# Patient Record
Sex: Female | Born: 1980 | Race: Black or African American | Hispanic: No | Marital: Single | Smoking: Former smoker
Health system: Southern US, Community
[De-identification: ages and names within clinical notes are randomized; demographics above are authoritative.]

## PROBLEM LIST (undated history)

## (undated) DIAGNOSIS — K219 Gastro-esophageal reflux disease without esophagitis: Secondary | ICD-10-CM

## (undated) DIAGNOSIS — D649 Anemia, unspecified: Secondary | ICD-10-CM

## (undated) DIAGNOSIS — I10 Essential (primary) hypertension: Secondary | ICD-10-CM

## (undated) DIAGNOSIS — G43909 Migraine, unspecified, not intractable, without status migrainosus: Secondary | ICD-10-CM

## (undated) DIAGNOSIS — J42 Unspecified chronic bronchitis: Secondary | ICD-10-CM

## (undated) HISTORY — DX: Migraine, unspecified, not intractable, without status migrainosus: G43.909

---

## 2008-08-27 HISTORY — PX: TUBAL LIGATION: SHX77

## 2015-02-25 ENCOUNTER — Emergency Department
Admission: EM | Admit: 2015-02-25 | Discharge: 2015-02-25 | Disposition: A | Discharge status: Home / Self Care | Payer: 59 | Attending: Emergency Medicine | Admitting: Emergency Medicine

## 2015-02-25 ENCOUNTER — Encounter: Payer: Self-pay | Admitting: Emergency Medicine

## 2015-02-25 DIAGNOSIS — B3731 Acute candidiasis of vulva and vagina: Secondary | ICD-10-CM

## 2015-02-25 DIAGNOSIS — B373 Candidiasis of vulva and vagina: Secondary | ICD-10-CM

## 2015-02-25 DIAGNOSIS — Z3202 Encounter for pregnancy test, result negative: Secondary | ICD-10-CM | POA: Diagnosis not present

## 2015-02-25 DIAGNOSIS — N390 Urinary tract infection, site not specified: Secondary | ICD-10-CM | POA: Diagnosis not present

## 2015-02-25 DIAGNOSIS — Z720 Tobacco use: Secondary | ICD-10-CM | POA: Insufficient documentation

## 2015-02-25 DIAGNOSIS — L293 Anogenital pruritus, unspecified: Secondary | ICD-10-CM | POA: Diagnosis present

## 2015-02-25 LAB — URINALYSIS COMPLETE WITH MICROSCOPIC (ARMC ONLY)
BILIRUBIN URINE: NEGATIVE
GLUCOSE, UA: NEGATIVE mg/dL
Ketones, ur: NEGATIVE mg/dL
Nitrite: NEGATIVE
Protein, ur: NEGATIVE mg/dL
SPECIFIC GRAVITY, URINE: 1.014 (ref 1.005–1.030)
pH: 6 (ref 5.0–8.0)

## 2015-02-25 LAB — CHLAMYDIA/NGC RT PCR (ARMC ONLY)
CHLAMYDIA TR: NOT DETECTED
N gonorrhoeae: NOT DETECTED

## 2015-02-25 LAB — POCT PREGNANCY, URINE: Preg Test, Ur: NEGATIVE

## 2015-02-25 LAB — WET PREP, GENITAL
Clue Cells Wet Prep HPF POC: NONE SEEN
Trich, Wet Prep: NONE SEEN

## 2015-02-25 MED ORDER — SULFAMETHOXAZOLE-TRIMETHOPRIM 800-160 MG PO TABS: 1.0000 | ORAL_TABLET | Freq: Two times a day (BID) | ORAL | Status: DC

## 2015-02-25 MED ORDER — FLUCONAZOLE 150 MG PO TABS
150.0000 mg | ORAL_TABLET | Freq: Every day | ORAL | Status: DC
Start: 2015-02-25 — End: 2017-03-10

## 2015-02-25 NOTE — ED Notes (Signed)
Pt states she wants to be checked for std, has a new partner and has noticed some "bumps" on him, states she his having vaginal itching and discharge this week, had blood in her urine a few days ago.

## 2015-02-25 NOTE — ED Provider Notes (Signed)
[facility] Emergency Department Provider Note  ____________________________________________  Time seen: 819  I have reviewed the triage vital signs and the nursing notes.   HISTORY  Chief Complaint Vaginal Itching; Vaginal Discharge; and SEXUALLY TRANSMITTED DISEASE   HPI Kaitlyn Hall is a 34 y.o. female is here with complaint of vaginal itching and discharge for 1 week. She states that her partner was here yesterday for bumps on his scrotum and she now wants to be checked out for an STD. She also noticed hematuria a few days ago and is not aware of any prior urinary tract infections. She is having some vaginal burning and burning on urination. She rates her pain as a day for 10. This is a constant pain with no improvement.   History reviewed. No pertinent past medical history.  There are no active problems to display for this patient.   Past Surgical History  Procedure Laterality Date  . Tubal ligation      Current Outpatient Rx  Name  Route  Sig  Dispense  Refill  . fluconazole (DIFLUCAN) 150 MG tablet   Oral   Take 1 tablet (150 mg total) by mouth daily.   1 tablet   0   . sulfamethoxazole-trimethoprim (BACTRIM DS,SEPTRA DS) 800-160 MG per tablet   Oral   Take 1 tablet by mouth 2 (two) times daily.   20 tablet   0     Allergies Review of patient's allergies indicates no known allergies.  No family history on file.  Social History History  Substance Use Topics  . Smoking status: Current Some Day Smoker    Types: Cigarettes  . Smokeless tobacco: Not on file  . Alcohol Use: No    Review of Systems Constitutional: No fever/chills Eyes: No visual changes. ENT: No sore throat. Cardiovascular: Denies chest pain. Respiratory: Denies shortness of breath. Gastrointestinal: No abdominal pain.  No nausea, no vomiting.   Genitourinary: Positive for dysuria. Musculoskeletal: Negative for back pain. Skin: Negative for  rash. Neurological: Negative for headaches, focal weakness or numbness.  10-point ROS otherwise negative.  ____________________________________________   PHYSICAL EXAM:  VITAL SIGNS: ED Triage Vitals  Enc Vitals Group     BP 02/25/15 0726 142/92 mmHg     Pulse Rate 02/25/15 0726 72     Resp 02/25/15 0726 18     Temp 02/25/15 0726 98.1 F (36.7 C)     Temp Source 02/25/15 0726 Oral     SpO2 02/25/15 0726 100 %     Weight 02/25/15 0726 225 lb (102.059 kg)     Height 02/25/15 0726 5\' 9"  (1.753 m)     Head Cir --      Peak Flow --      Pain Score 02/25/15 0727 8     Pain Loc --      Pain Edu? --      Excl. in GC? --     Constitutional: Alert and oriented. Well appearing and in no acute distress. Eyes: Conjunctivae are normal. PERRL. EOMI. Head: Atraumatic. Nose: No congestion/rhinnorhea. Neck: No stridor.   Cardiovascular: Normal rate, regular rhythm. Grossly normal heart sounds.  Good peripheral circulation. Respiratory: Normal respiratory effort.  No retractions. Lungs CTAB. Gastrointestinal: Soft and nontender. No distention Genitourinary: External genitalia somewhat irritated. Speculum exam moderate amount of white exudate in the vaginal vault. The bimanual exam minimal tenderness on palpation bilateral adnexal areas there is no cervical motion tenderness. Musculoskeletal: No lower extremity tenderness nor edema.  No  joint effusions. Neurologic:  Normal speech and language. No gross focal neurologic deficits are appreciated. Speech is normal. No gait instability. Skin:  Skin is warm, dry and intact. No rash noted. Psychiatric: Mood and affect are normal. Speech and behavior are normal.  ____________________________________________   LABS (all labs ordered are listed, but only abnormal results are displayed)  Labs Reviewed  WET PREP, GENITAL - Abnormal; Notable for the following:    Yeast Wet Prep HPF POC MANY (*)    WBC, Wet Prep HPF POC TOO NUMEROUS TO COUNT (*)     All other components within normal limits  URINALYSIS COMPLETEWITH MICROSCOPIC ([facility] ONLY) - Abnormal; Notable for the following:    Color, Urine YELLOW (*)    APPearance CLOUDY (*)    Hgb urine dipstick 1+ (*)    Leukocytes, UA 3+ (*)    Bacteria, UA MANY (*)    Squamous Epithelial / LPF 6-30 (*)    All other components within normal limits  CHLAMYDIA/NGC RT PCR ([facility] ONLY)  POC URINE PREG, ED  POCT PREGNANCY, URINE    PROCEDURES  Procedure(s) performed: None  Critical Care performed: No  ____________________________________________   INITIAL IMPRESSION / ASSESSMENT AND PLAN / ED COURSE  Pertinent labs & imaging results that were available during my care of the patient were reviewed by me and considered in my medical decision making (see chart for details).  Patient was treated with Diflucan for yeast and also Septra DS for 10 days. She was told if her STD tests came back with positive results should be called. ____________________________________________   FINAL CLINICAL IMPRESSION(S) / ED DIAGNOSES  Final diagnoses:  Vaginal yeast infection  Urinary tract infection, acute   Tommi Rumpsummers, PA-C 02/25/15 1308  Darien Ramus, MD 02/26/15 2252

## 2015-02-25 NOTE — Discharge Instructions (Signed)

## 2015-06-29 ENCOUNTER — Emergency Department
Admission: EM | Admit: 2015-06-29 | Discharge: 2015-06-29 | Disposition: A | Discharge status: Home / Self Care | Payer: 59 | Attending: Emergency Medicine | Admitting: Emergency Medicine

## 2015-06-29 ENCOUNTER — Encounter: Payer: Self-pay | Admitting: Emergency Medicine

## 2015-06-29 DIAGNOSIS — Z79899 Other long term (current) drug therapy: Secondary | ICD-10-CM | POA: Insufficient documentation

## 2015-06-29 DIAGNOSIS — R110 Nausea: Secondary | ICD-10-CM | POA: Insufficient documentation

## 2015-06-29 DIAGNOSIS — Z720 Tobacco use: Secondary | ICD-10-CM | POA: Diagnosis not present

## 2015-06-29 DIAGNOSIS — R05 Cough: Secondary | ICD-10-CM | POA: Diagnosis present

## 2015-06-29 DIAGNOSIS — R059 Cough, unspecified: Secondary | ICD-10-CM

## 2015-06-29 MED ORDER — PROMETHAZINE-CODEINE 6.25-10 MG/5ML PO SYRP: 5.0000 mL | ORAL_SOLUTION | Freq: Four times a day (QID) | ORAL | Status: DC | PRN

## 2015-06-29 NOTE — Discharge Instructions (Signed)

## 2015-06-29 NOTE — ED Provider Notes (Signed)
[facility] Emergency Department Provider Note  ____________________________________________  Time seen: Approximately 8:48 AM  I have reviewed the triage vital signs and the nursing notes.   HISTORY  Chief Complaint Cough    HPI Kaitlyn Hall is a 34 y.o. female complaining of cough for 1 week. Patient was seen at urgent care and diagnosed with bronchitis given a prescription for prednisone and Tussionex and doxycycline. Patient states continue to have cough. Patient states cough is also causing her to lose bladder control. Patient also stated this been mild nausea secondary to postnasal drainage. Patient denies any fever or chills associated this complaint. There is no diarrhea. Patient rates the pain discomfort as 2/10.   History reviewed. No pertinent past medical history.  There are no active problems to display for this patient.   Past Surgical History  Procedure Laterality Date  . Tubal ligation      Current Outpatient Rx  Name  Route  Sig  Dispense  Refill  . fluconazole (DIFLUCAN) 150 MG tablet   Oral   Take 1 tablet (150 mg total) by mouth daily.   1 tablet   0   . sulfamethoxazole-trimethoprim (BACTRIM DS,SEPTRA DS) 800-160 MG per tablet   Oral   Take 1 tablet by mouth 2 (two) times daily.   20 tablet   0     Allergies Review of patient's allergies indicates no known allergies.  No family history on file.  Social History Social History  Substance Use Topics  . Smoking status: Current Some Day Smoker    Types: Cigarettes  . Smokeless tobacco: None  . Alcohol Use: No    Review of Systems Constitutional: No fever/chills Eyes: No visual changes. ENT: No sore throat. Cardiovascular: Denies chest pain. Respiratory: Denies shortness of breath. Nonproductive cough Gastrointestinal: No abdominal pain.  Mild nausea, no vomiting.  No diarrhea.  No constipation. Genitourinary: Negative for dysuria. Musculoskeletal: Negative  for back pain. Skin: Negative for rash. Neurological: Negative for headaches, focal weakness or numbness. 10-point ROS otherwise negative.  ____________________________________________   PHYSICAL EXAM:  VITAL SIGNS: ED Triage Vitals  Enc Vitals Group     BP 06/29/15 0818 139/99 mmHg     Pulse Rate 06/29/15 0818 62     Resp 06/29/15 0818 18     Temp 06/29/15 0818 97.6 F (36.4 C)     Temp Source 06/29/15 0818 Oral     SpO2 06/29/15 0818 98 %     Weight 06/29/15 0818 220 lb (99.791 kg)     Height 06/29/15 0818 5\' 9"  (1.753 m)     Head Cir --      Peak Flow --      Pain Score 06/29/15 0820 2     Pain Loc --      Pain Edu? --      Excl. in GC? --     Constitutional: Alert and oriented. Well appearing and in no acute distress. Eyes: Conjunctivae are normal. PERRL. EOMI. Head: Atraumatic. Nose: No congestion/rhinnorhea. Mouth/Throat: Mucous membranes are moist.  Oropharynx non-erythematous. Neck: No stridor. No cervical spine tenderness to palpation. Hematological/Lymphatic/Immunilogical: No cervical lymphadenopathy. Cardiovascular: Normal rate, regular rhythm. Grossly normal heart sounds.  Good peripheral circulation. Respiratory: Normal respiratory effort.  No retractions. Lungs CTAB. Increased cough with deep inspiration. Gastrointestinal: Soft and nontender. No distention. No abdominal bruits. No CVA tenderness. Musculoskeletal: No lower extremity tenderness nor edema.  No joint effusions. Neurologic:  Normal speech and language. No gross focal neurologic deficits  are appreciated. No gait instability. Skin:  Skin is warm, dry and intact. No rash noted. Psychiatric: Mood and affect are normal. Speech and behavior are normal.  ____________________________________________   LABS (all labs ordered are listed, but only abnormal results are displayed)  Labs Reviewed - No data to  display ____________________________________________  EKG   ____________________________________________  RADIOLOGY   ____________________________________________   PROCEDURES  Procedure(s) performed: None  Critical Care performed: No  ____________________________________________   INITIAL IMPRESSION / ASSESSMENT AND PLAN / ED COURSE  Pertinent labs & imaging results that were available during my care of the patient were reviewed by me and considered in my medical decision making (see chart for details).  Resolving bronchitis. Advised patient continue taking medications as directed PRESCRIPTION FOR PHENERGAN WITH CODEINE. PATIENT ADVISED TO FOLLOW WITH PCP IF NO IMPROVEMENT 3 DAYS. ____________________________________________   FINAL CLINICAL IMPRESSION(S) / ED DIAGNOSES  Final diagnoses:  Cough      Joni Reining, PA-C 06/29/15 1610  Minna Antis, MD 06/29/15 1537

## 2015-06-29 NOTE — ED Notes (Signed)
Pt presents with cough since last Wednesday, seen at pcp last week and given prednisone and tussion, doxycycline. Pt reports cough is not any better at this time. No cough noted.

## 2016-03-24 ENCOUNTER — Emergency Department: Payer: 59

## 2016-03-24 ENCOUNTER — Emergency Department
Admission: EM | Admit: 2016-03-24 | Discharge: 2016-03-24 | Disposition: A | Discharge status: Home / Self Care | Payer: 59 | Attending: Emergency Medicine | Admitting: Emergency Medicine

## 2016-03-24 DIAGNOSIS — Y939 Activity, unspecified: Secondary | ICD-10-CM | POA: Insufficient documentation

## 2016-03-24 DIAGNOSIS — Y999 Unspecified external cause status: Secondary | ICD-10-CM | POA: Insufficient documentation

## 2016-03-24 DIAGNOSIS — F17210 Nicotine dependence, cigarettes, uncomplicated: Secondary | ICD-10-CM | POA: Diagnosis not present

## 2016-03-24 DIAGNOSIS — Y929 Unspecified place or not applicable: Secondary | ICD-10-CM | POA: Diagnosis not present

## 2016-03-24 DIAGNOSIS — S0990XA Unspecified injury of head, initial encounter: Secondary | ICD-10-CM | POA: Diagnosis present

## 2016-03-24 DIAGNOSIS — S060X0A Concussion without loss of consciousness, initial encounter: Secondary | ICD-10-CM

## 2016-03-24 MED ORDER — SUMATRIPTAN SUCCINATE 50 MG PO TABS: 50.0000 mg | ORAL_TABLET | Freq: Once | ORAL | 2 refills | Status: DC | PRN

## 2016-03-24 MED ORDER — BUTALBITAL-APAP-CAFFEINE 50-325-40 MG PO TABS: 1.0000 | ORAL_TABLET | Freq: Four times a day (QID) | ORAL | 0 refills | Status: DC | PRN

## 2016-03-24 MED ORDER — BUTALBITAL-APAP-CAFFEINE 50-325-40 MG PO TABS
2.0000 | ORAL_TABLET | Freq: Once | ORAL | Status: AC
  Administered 2016-03-24: 2 via ORAL
  Filled 2016-03-24: qty 2
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
## 2016-03-26 ENCOUNTER — Emergency Department
Admission: EM | Admit: 2016-03-26 | Discharge: 2016-03-26 | Disposition: A | Discharge status: Home / Self Care | Payer: 59 | Attending: Emergency Medicine | Admitting: Emergency Medicine

## 2016-03-26 DIAGNOSIS — R51 Headache: Secondary | ICD-10-CM | POA: Insufficient documentation

## 2016-03-26 DIAGNOSIS — R519 Headache, unspecified: Secondary | ICD-10-CM

## 2016-03-26 DIAGNOSIS — F17210 Nicotine dependence, cigarettes, uncomplicated: Secondary | ICD-10-CM | POA: Diagnosis not present

## 2016-03-26 DIAGNOSIS — G43909 Migraine, unspecified, not intractable, without status migrainosus: Secondary | ICD-10-CM | POA: Diagnosis present

## 2016-03-26 LAB — CBC WITH DIFFERENTIAL/PLATELET
BASOS ABS: 0 10*3/uL (ref 0–0.1)
BLASTS: 0 %
Band Neutrophils: 0 %
Basophils Relative: 0 %
EOS PCT: 1 %
Eosinophils Absolute: 0.1 10*3/uL (ref 0–0.7)
HCT: 37.4 % (ref 35.0–47.0)
HEMOGLOBIN: 12.4 g/dL (ref 12.0–16.0)
Lymphocytes Relative: 45 %
Lymphs Abs: 3.6 10*3/uL (ref 1.0–3.6)
MCH: 27.1 pg (ref 26.0–34.0)
MCHC: 33.2 g/dL (ref 32.0–36.0)
MCV: 81.8 fL (ref 80.0–100.0)
METAMYELOCYTES PCT: 0 %
Monocytes Absolute: 0.5 10*3/uL (ref 0.2–0.9)
Monocytes Relative: 6 %
Myelocytes: 0 %
NEUTROS PCT: 48 %
NRBC: 0 /100{WBCs}
Neutro Abs: 3.8 10*3/uL (ref 1.4–6.5)
Other: 0 %
Platelets: 260 10*3/uL (ref 150–440)
Promyelocytes Absolute: 0 %
RBC: 4.57 MIL/uL (ref 3.80–5.20)
RDW: 14.2 % (ref 11.5–14.5)
WBC: 8 10*3/uL (ref 3.6–11.0)

## 2016-03-26 LAB — COMPREHENSIVE METABOLIC PANEL
ALK PHOS: 48 U/L (ref 38–126)
ALT: 12 U/L — ABNORMAL LOW (ref 14–54)
ANION GAP: 5 (ref 5–15)
AST: 16 U/L (ref 15–41)
Albumin: 3.6 g/dL (ref 3.5–5.0)
BILIRUBIN TOTAL: 0.5 mg/dL (ref 0.3–1.2)
BUN: 8 mg/dL (ref 6–20)
CO2: 25 mmol/L (ref 22–32)
CREATININE: 0.76 mg/dL (ref 0.44–1.00)
Calcium: 8.6 mg/dL — ABNORMAL LOW (ref 8.9–10.3)
Chloride: 107 mmol/L (ref 101–111)
Glucose, Bld: 84 mg/dL (ref 65–99)
Potassium: 3.5 mmol/L (ref 3.5–5.1)
Sodium: 137 mmol/L (ref 135–145)
TOTAL PROTEIN: 6.3 g/dL — AB (ref 6.5–8.1)

## 2016-03-26 LAB — HCG, QUANTITATIVE, PREGNANCY

## 2016-03-26 LAB — GLUCOSE, CAPILLARY: GLUCOSE-CAPILLARY: 89 mg/dL (ref 65–99)

## 2016-03-26 MED ORDER — OXYCODONE-ACETAMINOPHEN 5-325 MG PO TABS: 2.0000 | ORAL_TABLET | Freq: Four times a day (QID) | ORAL | 0 refills | Status: DC | PRN

## 2016-03-26 MED ORDER — METOCLOPRAMIDE HCL 5 MG/ML IJ SOLN
10.0000 mg | Freq: Once | INTRAMUSCULAR | Status: AC
  Administered 2016-03-26: 10 mg via INTRAVENOUS
  Filled 2016-03-26 (×2): qty 2

## 2016-03-26 MED ORDER — HYDROMORPHONE HCL 1 MG/ML IJ SOLN
1.0000 mg | Freq: Once | INTRAMUSCULAR | Status: AC
Start: 2016-03-26 — End: 2016-03-26
  Administered 2016-03-26: 1 mg via INTRAVENOUS
  Filled 2016-03-26: qty 1

## 2016-03-26 MED ORDER — KETOROLAC TROMETHAMINE 30 MG/ML IJ SOLN
30.0000 mg | Freq: Once | INTRAMUSCULAR | Status: AC
  Administered 2016-03-26: 30 mg via INTRAVENOUS
  Filled 2016-03-26: qty 1

## 2016-03-26 MED ORDER — DIPHENHYDRAMINE HCL 50 MG/ML IJ SOLN
25.0000 mg | Freq: Once | INTRAMUSCULAR | Status: AC
  Administered 2016-03-26: 25 mg via INTRAVENOUS
  Filled 2016-03-26: qty 1

## 2016-03-26 MED ORDER — SODIUM CHLORIDE 0.9 % IV SOLN
Freq: Once | INTRAVENOUS | Status: AC
  Administered 2016-03-26: 17:00:00 via INTRAVENOUS

## 2016-03-26 NOTE — ED Notes (Signed)
CBG 89 
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
## 2016-03-26 NOTE — ED Notes (Signed)
Patient discharged to home per MD order. Patient in stable condition, and deemed medically cleared by ED provider for discharge. Discharge instructions reviewed with patient/family using "Teach Back"; verbalized understanding of medication education and administration, and information about follow-up care. Denies further concerns. ° °
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
## 2016-11-29 ENCOUNTER — Emergency Department: Payer: 59

## 2016-11-29 ENCOUNTER — Encounter: Payer: Self-pay | Admitting: Emergency Medicine

## 2016-11-29 DIAGNOSIS — R05 Cough: Secondary | ICD-10-CM | POA: Insufficient documentation

## 2016-11-29 DIAGNOSIS — F17210 Nicotine dependence, cigarettes, uncomplicated: Secondary | ICD-10-CM | POA: Diagnosis not present

## 2016-11-29 DIAGNOSIS — R0602 Shortness of breath: Secondary | ICD-10-CM | POA: Insufficient documentation

## 2016-11-29 DIAGNOSIS — Z5321 Procedure and treatment not carried out due to patient leaving prior to being seen by health care provider: Secondary | ICD-10-CM | POA: Diagnosis not present

## 2016-11-29 LAB — CBC
HEMATOCRIT: 36.4 % (ref 35.0–47.0)
Hemoglobin: 12.4 g/dL (ref 12.0–16.0)
MCH: 27.4 pg (ref 26.0–34.0)
MCHC: 34 g/dL (ref 32.0–36.0)
MCV: 80.7 fL (ref 80.0–100.0)
Platelets: 303 10*3/uL (ref 150–440)
RBC: 4.51 MIL/uL (ref 3.80–5.20)
RDW: 13.6 % (ref 11.5–14.5)
WBC: 13.6 10*3/uL — AB (ref 3.6–11.0)

## 2016-11-29 NOTE — ED Triage Notes (Signed)
Pt ambulatory to triage, report productive cough w/ green sputum x 3 days, reports sore throat and chest pain as well.  PT reports hx of bronchitis

## 2016-11-30 ENCOUNTER — Telehealth: Payer: Self-pay | Admitting: Emergency Medicine

## 2016-11-30 ENCOUNTER — Emergency Department
Admission: EM | Admit: 2016-11-30 | Discharge: 2016-11-30 | Disposition: A | Discharge status: Home / Self Care | Payer: 59 | Attending: Emergency Medicine | Admitting: Emergency Medicine

## 2016-11-30 LAB — BASIC METABOLIC PANEL
Anion gap: 5 (ref 5–15)
BUN: 12 mg/dL (ref 6–20)
CO2: 26 mmol/L (ref 22–32)
Calcium: 9 mg/dL (ref 8.9–10.3)
Chloride: 106 mmol/L (ref 101–111)
Creatinine, Ser: 0.83 mg/dL (ref 0.44–1.00)
GFR calc Af Amer: >60 mL/min (ref >60)
GFR calc non Af Amer: >60 mL/min (ref >60)
GLUCOSE: 121 mg/dL — AB (ref 65–99)
Potassium: 3.6 mmol/L (ref 3.5–5.1)
SODIUM: 137 mmol/L (ref 135–145)

## 2016-11-30 LAB — TROPONIN I: Troponin I: <0.03 ng/mL (ref <0.03)

## 2016-11-30 NOTE — ED Notes (Signed)
No answer when called several times from lobby 
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
## 2017-03-08 ENCOUNTER — Encounter: Payer: Self-pay | Admitting: Emergency Medicine

## 2017-03-08 ENCOUNTER — Emergency Department
Admission: EM | Admit: 2017-03-08 | Discharge: 2017-03-08 | Disposition: A | Discharge status: Home / Self Care | Payer: 59 | Attending: Emergency Medicine | Admitting: Emergency Medicine

## 2017-03-08 DIAGNOSIS — B9689 Other specified bacterial agents as the cause of diseases classified elsewhere: Secondary | ICD-10-CM | POA: Insufficient documentation

## 2017-03-08 DIAGNOSIS — N760 Acute vaginitis: Secondary | ICD-10-CM | POA: Diagnosis not present

## 2017-03-08 DIAGNOSIS — Z79899 Other long term (current) drug therapy: Secondary | ICD-10-CM | POA: Diagnosis not present

## 2017-03-08 DIAGNOSIS — L02415 Cutaneous abscess of right lower limb: Secondary | ICD-10-CM | POA: Diagnosis present

## 2017-03-08 DIAGNOSIS — F17210 Nicotine dependence, cigarettes, uncomplicated: Secondary | ICD-10-CM | POA: Insufficient documentation

## 2017-03-08 DIAGNOSIS — L0291 Cutaneous abscess, unspecified: Secondary | ICD-10-CM

## 2017-03-08 LAB — CHLAMYDIA/NGC RT PCR (ARMC ONLY)
Chlamydia Tr: NOT DETECTED
N GONORRHOEAE: NOT DETECTED

## 2017-03-08 LAB — WET PREP, GENITAL
SPERM: NONE SEEN
Trich, Wet Prep: NONE SEEN
Yeast Wet Prep HPF POC: NONE SEEN

## 2017-03-08 MED ORDER — METRONIDAZOLE 500 MG PO TABS
500.0000 mg | ORAL_TABLET | Freq: Once | ORAL | Status: AC
  Administered 2017-03-08: 500 mg via ORAL
  Filled 2017-03-08: qty 1

## 2017-03-08 MED ORDER — CEPHALEXIN 500 MG PO CAPS: 500.0000 mg | ORAL_CAPSULE | Freq: Four times a day (QID) | ORAL | 0 refills | Status: DC

## 2017-03-08 MED ORDER — LIDOCAINE HCL 1 % IJ SOLN
5.0000 mL | Freq: Once | INTRAMUSCULAR | Status: AC
  Administered 2017-03-08: 5 mL via INTRADERMAL

## 2017-03-08 MED ORDER — LIDOCAINE HCL (PF) 1 % IJ SOLN
INTRAMUSCULAR | Status: AC
  Administered 2017-03-08: 5 mL via INTRADERMAL
  Filled 2017-03-08: qty 5

## 2017-03-08 MED ORDER — METRONIDAZOLE 500 MG PO TABS: 500.0000 mg | ORAL_TABLET | Freq: Two times a day (BID) | ORAL | 0 refills | Status: DC

## 2017-03-08 NOTE — ED Provider Notes (Signed)
[facility] Emergency Department Provider Note  ____________________________________________  Time seen: Approximately 8:18 PM  I have reviewed the triage vital signs and the nursing notes.   HISTORY  Chief Complaint Abscess    HPI Kaitlyn Hall is a 36 y.o. female that presents to the emergency department with mass on the inner right thigh and white vaginal discharge. Patient states that she developed a lump on her inner thigh earlier this week. She began taking a prescription for doxycycline that she had a home. Lump decreased about half the size after 4 pills of doxycycline. Yesterday she noticed white discharge. She denies fever, shortness breath, chest pain, nausea, vomiting, abdominal pain, back pain, dysuria, urgency, frequency.   History reviewed. No pertinent past medical history.  There are no active problems to display for this patient.   Past Surgical History:  Procedure Laterality Date  . CESAREAN SECTION    . TUBAL LIGATION      Prior to Admission medications   Medication Sig Start Date End Date Taking? Authorizing Provider  butalbital-acetaminophen-caffeine (FIORICET) 50-325-40 MG tablet Take 1-2 tablets by mouth every 6 (six) hours as needed for headache. 03/24/16 03/24/17  Emily Filbert, MD  cephALEXin (KEFLEX) 500 MG capsule Take 1 capsule (500 mg total) by mouth 4 (four) times daily. 03/08/17 03/18/17  Enid Derry, PA-C  fluconazole (DIFLUCAN) 150 MG tablet Take 1 tablet (150 mg total) by mouth daily. 02/25/15   Tommi Rumps, PA-C  metroNIDAZOLE (FLAGYL) 500 MG tablet Take 1 tablet (500 mg total) by mouth 2 (two) times daily. 03/08/17 03/15/17  Enid Derry, PA-C  oxyCODONE-acetaminophen (PERCOCET) 5-325 MG tablet Take 2 tablets by mouth every 6 (six) hours as needed for moderate pain or severe pain. 03/26/16   Emily Filbert, MD  promethazine-codeine (PHENERGAN WITH CODEINE) 6.25-10 MG/5ML syrup Take 5 mLs by mouth  every 6 (six) hours as needed for cough. 06/29/15   Joni Reining, PA-C  sulfamethoxazole-trimethoprim (BACTRIM DS,SEPTRA DS) 800-160 MG per tablet Take 1 tablet by mouth 2 (two) times daily. 02/25/15   Tommi Rumps, PA-C  SUMAtriptan (IMITREX) 50 MG tablet Take 1 tablet (50 mg total) by mouth once as needed for migraine. May repeat in 2 hours if headache persists or recurs. 03/24/16 03/25/17  Emily Filbert, MD    Allergies Codeine  No family history on file.  Social History Social History  Substance Use Topics  . Smoking status: Current Every Day Smoker    Packs/day: 1.00    Types: Cigarettes  . Smokeless tobacco: Never Used  . Alcohol use Yes     Comment: occasionally     Review of Systems  Constitutional: No fever/chills Cardiovascular: No chest pain. Respiratory: No SOB. Gastrointestinal: No abdominal pain.  No nausea, no vomiting.  Musculoskeletal: Negative for musculoskeletal pain. Skin: Negative for  abrasions, lacerations, ecchymosis. Neurological: Negative for headaches, numbness or tingling   ____________________________________________   PHYSICAL EXAM:  VITAL SIGNS: ED Triage Vitals  Enc Vitals Group     BP 03/08/17 1936 (!) 148/87     Pulse Rate 03/08/17 1936 74     Resp 03/08/17 1936 16     Temp 03/08/17 1936 98.7 F (37.1 C)     Temp Source 03/08/17 1936 Oral     SpO2 --      Weight 03/08/17 1938 215 lb (97.5 kg)     Height 03/08/17 1938 5\' 9"  (1.753 m)     Head Circumference --  Peak Flow --      Pain Score 03/08/17 1936 8     Pain Loc --      Pain Edu? --      Excl. in GC? --      Constitutional: Alert and oriented. Well appearing and in no acute distress. Eyes: Conjunctivae are normal. PERRL. EOMI. Head: Atraumatic. ENT:      Ears:      Nose: No congestion/rhinnorhea.      Mouth/Throat: Mucous membranes are moist.  Neck: No stridor.   Cardiovascular: Normal rate, regular rhythm.  Good peripheral circulation. Respiratory:  Normal respiratory effort without tachypnea or retractions. Lungs CTAB. Good air entry to the bases with no decreased or absent breath sounds. Gastrointestinal: Bowel sounds 4 quadrants. Soft and nontender to palpation. No guarding or rigidity. No palpable masses. No distention.  Genitourinary: RN present for pelvic exam. No external lesions. White discharge present in vaginal canal. No cervical motion tenderness. Musculoskeletal: Full range of motion to all extremities. No gross deformities appreciated. Neurologic:  Normal speech and language. No gross focal neurologic deficits are appreciated.  Skin:  Skin is warm, dry and intact. 0.5 cm x 0.5 cm fluctuant mass on inner right thigh. Psychiatric: Mood and affect are normal. Speech and behavior are normal. Patient exhibits appropriate insight and judgement.   ____________________________________________   LABS (all labs ordered are listed, but only abnormal results are displayed)  Labs Reviewed  WET PREP, GENITAL - Abnormal; Notable for the following:       Result Value   Clue Cells Wet Prep HPF POC PRESENT (*)    WBC, Wet Prep HPF POC FEW (*)    All other components within normal limits  CHLAMYDIA/NGC RT PCR ([facility] ONLY)   ____________________________________________  EKG   ____________________________________________  RADIOLOGY   No results found.  ____________________________________________    PROCEDURES  Procedure(s) performed:    Procedures  INCISION AND DRAINAGE Performed by: Enid Derry Consent: Verbal consent obtained. Risks and benefits: risks, benefits and alternatives were discussed Type: abscess  Body area: Right thigh  Anesthesia: local infiltration  Incision was made with a scalpel.  Local anesthetic: lidocaine 1% without epinephrine  Anesthetic total: 3 ml  Complexity: complex Blunt dissection to break up loculations  Drainage: purulent  Drainage amount: 1cc  Packing  material: 1/4 in iodoform gauze  Patient tolerance: Patient tolerated the procedure well with no immediate complications.    Medications  lidocaine (XYLOCAINE) 1 % (with pres) injection 5 mL (5 mLs Intradermal Given 03/08/17 2133)  metroNIDAZOLE (FLAGYL) tablet 500 mg (500 mg Oral Given 03/08/17 2132)     ____________________________________________   INITIAL IMPRESSION / ASSESSMENT AND PLAN / ED COURSE  Pertinent labs & imaging results that were available during my care of the patient were reviewed by me and considered in my medical decision making (see chart for details).  Review of the [place] CSRS was performed in accordance of the [facility] prior to dispensing any controlled drugs.   Patient's diagnosis is consistent with bacterial vaginosis and abscess. Vital signs and exam are reassuring. Wet prep consistent with bacterial vaginosis. We discussed not doing I&D since abscess was getting smaller. Patient elected to have I&D completed. Patient will be discharged home with prescriptions for keflex and flagyl. Patient is to follow up with PCP as directed. Patient is given ED precautions to return to the ED for any worsening or new symptoms.     ____________________________________________  FINAL CLINICAL IMPRESSION(S) / ED DIAGNOSES  Final  diagnoses:  Abscess  Bacterial vaginosis      NEW MEDICATIONS STARTED DURING THIS VISIT:  Discharge Medication List as of 03/08/2017  9:41 PM    START taking these medications   Details  cephALEXin (KEFLEX) 500 MG capsule Take 1 capsule (500 mg total) by mouth 4 (four) times daily., Starting Fri 03/08/2017, Until Mon 03/18/2017, Print    metroNIDAZOLE (FLAGYL) 500 MG tablet Take 1 tablet (500 mg total) by mouth 2 (two) times daily., Starting Fri 03/08/2017, Until Fri 03/15/2017, Print            This chart was dictated using voice recognition software/Dragon. Despite best efforts to proofread, errors can occur which can change the meaning.  Any change was purely unintentional.    Enid DerryWagner, Raevon Broom, PA-C 03/08/17 2336    Don PerkingVeronese, [place], MD 03/12/17 [phone]

## 2017-03-08 NOTE — ED Triage Notes (Signed)
Pt is ambulatory to triage with c/o boil to right inner thigh. Pt states that she is also having some vaginal itching. Pt is in NAD at this time.

## 2017-03-10 ENCOUNTER — Encounter: Payer: Self-pay | Admitting: Emergency Medicine

## 2017-03-10 ENCOUNTER — Emergency Department
Admission: EM | Admit: 2017-03-10 | Discharge: 2017-03-10 | Disposition: A | Discharge status: Home / Self Care | Payer: 59 | Attending: Emergency Medicine | Admitting: Emergency Medicine

## 2017-03-10 DIAGNOSIS — Z5189 Encounter for other specified aftercare: Secondary | ICD-10-CM

## 2017-03-10 DIAGNOSIS — Z4801 Encounter for change or removal of surgical wound dressing: Secondary | ICD-10-CM | POA: Diagnosis not present

## 2017-03-10 DIAGNOSIS — Z79899 Other long term (current) drug therapy: Secondary | ICD-10-CM | POA: Diagnosis not present

## 2017-03-10 DIAGNOSIS — F17210 Nicotine dependence, cigarettes, uncomplicated: Secondary | ICD-10-CM | POA: Insufficient documentation

## 2017-03-10 NOTE — ED Triage Notes (Addendum)
Pt here on Friday had I & D of right inner thigh abscess, was packed.  Here to have packing removed. Pt c/o pain to area states she has been taking ibuprofen and tylenol with minimal to no relief.

## 2017-03-10 NOTE — Discharge Instructions (Signed)
Warm compresses to the area frequently. You may shower or bathe as regular. Continue antibiotics until completely finished. You may take Tylenol or ibuprofen as needed for pain. Follow-up with your primary care provider on [address] if needed.

## 2017-03-10 NOTE — ED Provider Notes (Signed)
[facility] Emergency Department Provider Note   ____________________________________________   First MD Initiated Contact with Patient 03/10/17 [phone]     (approximate)  I have reviewed the triage vital signs and the nursing notes.   HISTORY  Chief Complaint Wound Check    HPI Kaitlyn Hall is a 36 y.o. female was seen in emergency room on 03/08/17 for I&D of an abscess on her right thigh. Patient is here to have packing removed. She continues to take anabolic subjective any difficulty.   History reviewed. No pertinent past medical history.  There are no active problems to display for this patient.   Past Surgical History:  Procedure Laterality Date  . CESAREAN SECTION    . TUBAL LIGATION      Prior to Admission medications   Medication Sig Start Date End Date Taking? Authorizing Provider  sulfamethoxazole-trimethoprim (BACTRIM DS,SEPTRA DS) 800-160 MG per tablet Take 1 tablet by mouth 2 (two) times daily. 02/25/15   Tommi RumpsSummers, Ansley Stanwood L, PA-C  SUMAtriptan (IMITREX) 50 MG tablet Take 1 tablet (50 mg total) by mouth once as needed for migraine. May repeat in 2 hours if headache persists or recurs. 03/24/16 03/25/17  Emily FilbertWilliams, Jonathan E, MD    Allergies Codeine  History reviewed. No pertinent family history.  Social History Social History  Substance Use Topics  . Smoking status: Current Every Day Smoker    Packs/day: 1.00    Types: Cigarettes  . Smokeless tobacco: Never Used  . Alcohol use Yes     Comment: occasionally    Review of Systems   ____________________________________________   PHYSICAL EXAM:  VITAL SIGNS: ED Triage Vitals  Enc Vitals Group     BP 03/10/17 0917 (!) 143/96     Pulse Rate 03/10/17 0917 82     Resp 03/10/17 0917 18     Temp 03/10/17 0917 98.1 F (36.7 C)     Temp Source 03/10/17 0917 Oral     SpO2 03/10/17 0917 99 %     Weight --      Height --      Head Circumference --      Peak Flow --      Pain  Score 03/10/17 0914 8     Pain Loc --      Pain Edu? --      Excl. in GC? --    PROCEDURES  Procedure(s) performed: Packing was removed by provider.  ____________________________________________   INITIAL IMPRESSION / ASSESSMENT AND PLAN / ED COURSE  Pertinent labs & imaging results that were available during my care of the patient were reviewed by me and considered in my medical decision making (see chart for details).  Patient is continue taking antibiotics until completely finished. She is to follow-up with her PCP if any continued problems. She is encouraged to continue use warm compresses to the area.     ____________________________________________   FINAL CLINICAL IMPRESSION(S) / ED DIAGNOSES  Final diagnoses:  Wound check, abscess     Tommi RumpsSummers, Swan [place] L, PA-C 03/10/17 1033    Sharyn CreamerQuale, Mark, MD 03/10/17 1309

## 2017-04-23 ENCOUNTER — Emergency Department: Payer: 59

## 2017-04-23 ENCOUNTER — Emergency Department
Admission: EM | Admit: 2017-04-23 | Discharge: 2017-04-23 | Disposition: A | Discharge status: Home / Self Care | Payer: 59 | Attending: Emergency Medicine | Admitting: Emergency Medicine

## 2017-04-23 ENCOUNTER — Other Ambulatory Visit: Payer: Self-pay

## 2017-04-23 DIAGNOSIS — R05 Cough: Secondary | ICD-10-CM | POA: Diagnosis present

## 2017-04-23 DIAGNOSIS — J40 Bronchitis, not specified as acute or chronic: Secondary | ICD-10-CM | POA: Insufficient documentation

## 2017-04-23 DIAGNOSIS — Z79899 Other long term (current) drug therapy: Secondary | ICD-10-CM | POA: Diagnosis not present

## 2017-04-23 DIAGNOSIS — F17210 Nicotine dependence, cigarettes, uncomplicated: Secondary | ICD-10-CM | POA: Diagnosis not present

## 2017-04-23 LAB — CBC WITH DIFFERENTIAL/PLATELET
Basophils Absolute: 0 10*3/uL (ref 0–0.1)
Basophils Relative: 0 %
EOS ABS: 0.1 10*3/uL (ref 0–0.7)
Eosinophils Relative: 1 %
HEMATOCRIT: 36.7 % (ref 35.0–47.0)
HEMOGLOBIN: 12.2 g/dL (ref 12.0–16.0)
LYMPHS ABS: 1.2 10*3/uL (ref 1.0–3.6)
LYMPHS PCT: 11 %
MCH: 27.7 pg (ref 26.0–34.0)
MCHC: 33.3 g/dL (ref 32.0–36.0)
MCV: 83.3 fL (ref 80.0–100.0)
MONOS PCT: 5 %
Monocytes Absolute: 0.5 10*3/uL (ref 0.2–0.9)
Neutro Abs: 8.4 10*3/uL — ABNORMAL HIGH (ref 1.4–6.5)
Neutrophils Relative %: 83 %
Platelets: 259 10*3/uL (ref 150–440)
RBC: 4.4 MIL/uL (ref 3.80–5.20)
RDW: 13.9 % (ref 11.5–14.5)
WBC: 10.2 10*3/uL (ref 3.6–11.0)

## 2017-04-23 LAB — BASIC METABOLIC PANEL
Anion gap: 7 (ref 5–15)
BUN: 10 mg/dL (ref 6–20)
CHLORIDE: 106 mmol/L (ref 101–111)
CO2: 25 mmol/L (ref 22–32)
CREATININE: 0.78 mg/dL (ref 0.44–1.00)
Calcium: 8.7 mg/dL — ABNORMAL LOW (ref 8.9–10.3)
GFR calc non Af Amer: >60 mL/min (ref >60)
Glucose, Bld: 91 mg/dL (ref 65–99)
POTASSIUM: 4.1 mmol/L (ref 3.5–5.1)
Sodium: 138 mmol/L (ref 135–145)

## 2017-04-23 LAB — TROPONIN I

## 2017-04-23 LAB — FIBRIN DERIVATIVES D-DIMER (ARMC ONLY): Fibrin derivatives D-dimer (ARMC): 162.03 (ref 0.00–499.00)

## 2017-04-23 MED ORDER — PREDNISONE 20 MG PO TABS: 20.0000 mg | ORAL_TABLET | Freq: Every day | ORAL | 0 refills | Status: DC

## 2017-04-23 MED ORDER — HYDROCOD POLST-CPM POLST ER 10-8 MG/5ML PO SUER: 5.0000 mL | Freq: Two times a day (BID) | ORAL | 0 refills | Status: DC

## 2017-04-23 MED ORDER — IPRATROPIUM-ALBUTEROL 0.5-2.5 (3) MG/3ML IN SOLN
3.0000 mL | Freq: Once | RESPIRATORY_TRACT | Status: AC
  Administered 2017-04-23: 3 mL via RESPIRATORY_TRACT
  Filled 2017-04-23: qty 3

## 2017-04-23 MED ORDER — HYDROCOD POLST-CPM POLST ER 10-8 MG/5ML PO SUER
5.0000 mL | Freq: Once | ORAL | Status: DC
  Filled 2017-04-23: qty 5

## 2017-04-23 MED ORDER — PREDNISONE 20 MG PO TABS
60.0000 mg | ORAL_TABLET | Freq: Once | ORAL | Status: AC
  Administered 2017-04-23: 60 mg via ORAL
  Filled 2017-04-23: qty 3

## 2017-04-23 MED ORDER — AZITHROMYCIN 250 MG PO TABS: ORAL_TABLET | ORAL | 0 refills | Status: AC

## 2017-04-23 NOTE — Discharge Instructions (Signed)
Use the Tussionex as directed and prednisone once a day for 5 days and the Zithromax as directed. Please return if you're worse including fever, chills, short of breath or worse coughing. Follow-up with your doctor in the next week or so.

## 2017-04-23 NOTE — ED Provider Notes (Signed)
[facility] Emergency Department Provider Note   ____________________________________________   First MD Initiated Contact with Patient 04/23/17 0405     (approximate)  I have reviewed the triage vital signs and the nursing notes.   HISTORY  Chief Complaint Cough   HPI Kaitlyn Hall is a 36 y.o. female Who reports she is prone to bronchitis. She's been having a dry but very forceful cough since Friday. 4.5 days ago.height reports she's getting achy. She is not running a fever.She has felt chills. She is asking for some strong cough medicine because she's tried NyQuil DayQuil and other over-the-counter cough medicines with no relief. 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   No past medical history on file.  There are no active problems to display for this patient.   Past Surgical History:  Procedure Laterality Date  . CESAREAN SECTION    . TUBAL LIGATION      Prior to Admission medications   Medication Sig Start Date End Date Taking? Authorizing Provider  azithromycin (ZITHROMAX Z-PAK) 250 MG tablet Take 2 tablets (500 mg) on  Day 1,  followed by 1 tablet (250 mg) once daily on Days 2 through 5. 04/23/17 04/28/17  Arnaldo Natal, MD  chlorpheniramine-HYDROcodone (TUSSIONEX PENNKINETIC ER) 10-8 MG/5ML SUER Take 5 mLs by mouth 2 (two) times daily. 04/23/17   Arnaldo Natal, MD  predniSONE (DELTASONE) 20 MG tablet Take 1 tablet (20 mg total) by mouth daily. 04/23/17 04/23/18  Arnaldo Natal, MD  sulfamethoxazole-trimethoprim (BACTRIM DS,SEPTRA DS) 800-160 MG per tablet Take 1 tablet by mouth 2 (two) times daily. 02/25/15   Tommi Rumps, PA-C  SUMAtriptan (IMITREX) 50 MG tablet Take 1 tablet (50 mg total) by mouth once as needed for migraine. May repeat in 2 hours if headache persists or recurs. 03/24/16 03/25/17  Emily Filbert, MD    Allergies Codeine  No family history on  file.  Social History Social History  Substance Use Topics  . Smoking status: Current Every Day Smoker    Packs/day: 1.00    Types: Cigarettes  . Smokeless tobacco: Never Used  . Alcohol use Yes     Comment: occasionally    Review of Systems  Constitutional: ee history of present illness Eyes: No visual changes. ENT: No sore throat. Cardiovascular: he history of present illness Respiratory: shortness of breath. Gastrointestinal: No abdominal pain.  No nausea, no vomiting.  No diarrhea.  No constipation. Genitourinary: Negative for dysuria. Musculoskeletal: Negative for back pain. Skin: Negative for rash. Neurological: Negative for headaches, focal weakness  ____________________________________________   PHYSICAL EXAM:  VITAL SIGNS: ED Triage Vitals [04/23/17 0318]  Enc Vitals Group     BP 140/84     Pulse Rate 81     Resp 20     Temp 98.9 F (37.2 C)     Temp Source Oral     SpO2 96 %     Weight 220 lb (99.8 kg)     Height 5\' 9"  (1.753 m)     Head Circumference      Peak Flow      Pain Score 10     Pain Loc      Pain Edu?      Excl. in GC?     Constitutional: Alert and oriented. Well appearing and in no acute distress. Eyes: Conjunctivae are normal. Head: Atraumatic. Nose: No congestion/rhinnorhea. Mouth/Throat: Mucous membranes  are moist.  Oropharynx non-erythematous. Neck: No stridor.   Cardiovascular: Normal rate, regular rhythm. Grossly normal heart sounds.  Good peripheral circulation. Respiratory: Normal respiratory effort.  No retractions. Lungs CTAB. Gastrointestinal: Soft and nontender. No distention. No abdominal bruits. No CVA tenderness. Musculoskeletal: No lower extremity tenderness nor edema.  No joint effusions. Neurologic:  Normal speech and language. No gross focal neurologic deficits are appreciated. No gait instability. Skin:  Skin is warm, dry and intact. No rash noted. Psychiatric: Mood and affect are normal. Speech and behavior are  normal.  ____________________________________________   LABS (all labs ordered are listed, but only abnormal results are displayed)  Labs Reviewed  BASIC METABOLIC PANEL - Abnormal; Notable for the following:       Result Value   Calcium 8.7 (*)    All other components within normal limits  CBC WITH DIFFERENTIAL/PLATELET - Abnormal; Notable for the following:    Neutro Abs 8.4 (*)    All other components within normal limits  FIBRIN DERIVATIVES D-DIMER [facility] ONLY)  TROPONIN I   ____________________________________________  EKG   ____________________________________________  RADIOLOGY  Dg Chest 2 View  Result Date: 04/23/2017 CLINICAL DATA:  Cough and body aches for 3 days. EXAM: CHEST  2 VIEW COMPARISON:  11/29/2016 FINDINGS: The lungs are clear. The pulmonary vasculature is normal. Heart size is normal. Hilar and mediastinal contours are unremarkable. There is no pleural effusion. IMPRESSION: No active cardiopulmonary disease. Electronically Signed   By: Ellery Plunk M.D.   On: 04/23/2017 03:59    ____________________________________________   PROCEDURES  Procedure(s) performed:   Procedures  Critical Care performed:   ____________________________________________   INITIAL IMPRESSION / ASSESSMENT AND PLAN / ED COURSE  Pertinent labs & imaging results that were available during my care of the patient were reviewed by me and considered in my medical decision making (see chart for details).  Patient better after an inhaler. She says Tussionex will work for her. She also asked for prednisone.      ____________________________________________   FINAL CLINICAL IMPRESSION(S) / ED DIAGNOSES  Final diagnoses:  Bronchitis      NEW MEDICATIONS STARTED DURING THIS VISIT:  New Prescriptions   AZITHROMYCIN (ZITHROMAX Z-PAK) 250 MG TABLET    Take 2 tablets (500 mg) on  Day 1,  followed by 1 tablet (250 mg) once daily on Days 2 through 5.    CHLORPHENIRAMINE-HYDROCODONE (TUSSIONEX PENNKINETIC ER) 10-8 MG/5ML SUER    Take 5 mLs by mouth 2 (two) times daily.   PREDNISONE (DELTASONE) 20 MG TABLET    Take 1 tablet (20 mg total) by mouth daily.     Note:  This document was prepared using Dragon voice recognition software and may include unintentional dictation errors.    Arnaldo Natal, MD 04/23/17 [phone]

## 2017-04-23 NOTE — ED Triage Notes (Signed)
Pt in with cough x 3 days, body aches and congestion.

## 2017-08-21 ENCOUNTER — Emergency Department
Admission: EM | Admit: 2017-08-21 | Discharge: 2017-08-21 | Disposition: A | Discharge status: Home / Self Care | Payer: 59 | Attending: Emergency Medicine | Admitting: Emergency Medicine

## 2017-08-21 ENCOUNTER — Other Ambulatory Visit: Payer: Self-pay

## 2017-08-21 ENCOUNTER — Encounter: Payer: Self-pay | Admitting: Emergency Medicine

## 2017-08-21 DIAGNOSIS — Z98811 Dental restoration status: Secondary | ICD-10-CM | POA: Diagnosis not present

## 2017-08-21 DIAGNOSIS — Z79899 Other long term (current) drug therapy: Secondary | ICD-10-CM | POA: Diagnosis not present

## 2017-08-21 DIAGNOSIS — K0889 Other specified disorders of teeth and supporting structures: Secondary | ICD-10-CM | POA: Insufficient documentation

## 2017-08-21 DIAGNOSIS — F17210 Nicotine dependence, cigarettes, uncomplicated: Secondary | ICD-10-CM | POA: Diagnosis not present

## 2017-08-21 MED ORDER — IBUPROFEN 800 MG PO TABS
800.0000 mg | ORAL_TABLET | Freq: Once | ORAL | Status: AC
  Administered 2017-08-21: 800 mg via ORAL
  Filled 2017-08-21: qty 1

## 2017-08-21 MED ORDER — OXYCODONE-ACETAMINOPHEN 5-325 MG PO TABS
1.0000 | ORAL_TABLET | Freq: Once | ORAL | Status: AC
  Administered 2017-08-21: 1 via ORAL
  Filled 2017-08-21: qty 1

## 2017-08-21 MED ORDER — OXYCODONE-ACETAMINOPHEN 5-325 MG PO TABS: 1.0000 | ORAL_TABLET | ORAL | 0 refills | Status: DC | PRN

## 2017-08-21 MED ORDER — LIDOCAINE VISCOUS 2 % MT SOLN
15.0000 mL | Freq: Once | OROMUCOSAL | Status: AC
  Administered 2017-08-21: 15 mL via OROMUCOSAL
  Filled 2017-08-21: qty 15

## 2017-08-21 MED ORDER — IBUPROFEN 800 MG PO TABS: 800.0000 mg | ORAL_TABLET | Freq: Three times a day (TID) | ORAL | 0 refills | Status: DC | PRN

## 2017-08-21 NOTE — ED Triage Notes (Addendum)
Patient to ER for c/o dental pain. States filling came out on Friday.

## 2017-08-21 NOTE — ED Provider Notes (Signed)
[facility] Emergency Department Provider Note   ____________________________________________   First MD Initiated Contact with Patient 08/21/17 [phone]     (approximate)  I have reviewed the triage vital signs and the nursing notes.   HISTORY  Chief Complaint Dental Pain    HPI Kaitlyn Hall is a 36 y.o. female who presents to the ED from home with a chief complaint of toothache.  Patient states she was eating something hard and her filling came out of her left lower molar 4 days ago.  She immediately packed it with over-the-counter dental cement but has been having persistent pain despite ibuprofen.  Denies associated fever, chills, swelling, chest pain, shortness of breath, abdominal pain, nausea or vomiting.  Denies recent trauma.   Past medical history None  There are no active problems to display for this patient.   Past Surgical History:  Procedure Laterality Date  . CESAREAN SECTION    . TUBAL LIGATION      Prior to Admission medications   Medication Sig Start Date End Date Taking? Authorizing Provider  chlorpheniramine-HYDROcodone (TUSSIONEX PENNKINETIC ER) 10-8 MG/5ML SUER Take 5 mLs by mouth 2 (two) times daily. 04/23/17   Arnaldo NatalMalinda, Paul F, MD  ibuprofen (ADVIL,MOTRIN) 800 MG tablet Take 1 tablet (800 mg total) by mouth every 8 (eight) hours as needed for moderate pain. 08/21/17   Irean HongSung, Burdett Pinzon J, MD  oxyCODONE-acetaminophen (ROXICET) 5-325 MG tablet Take 1 tablet by mouth every 4 (four) hours as needed for severe pain. 08/21/17   Irean HongSung, Avanti Jetter J, MD  predniSONE (DELTASONE) 20 MG tablet Take 1 tablet (20 mg total) by mouth daily. 04/23/17 04/23/18  Arnaldo NatalMalinda, Paul F, MD  sulfamethoxazole-trimethoprim (BACTRIM DS,SEPTRA DS) 800-160 MG per tablet Take 1 tablet by mouth 2 (two) times daily. 02/25/15   Tommi RumpsSummers, Rhonda L, PA-C  SUMAtriptan (IMITREX) 50 MG tablet Take 1 tablet (50 mg total) by mouth once as needed for migraine. May repeat in 2 hours if  headache persists or recurs. 03/24/16 03/25/17  Emily FilbertWilliams, Jonathan E, MD    Allergies Codeine  No family history on file.  Social History Social History   Tobacco Use  . Smoking status: Current Every Day Smoker    Packs/day: 1.00    Types: Cigarettes  . Smokeless tobacco: Never Used  Substance Use Topics  . Alcohol use: Yes    Comment: occasionally  . Drug use: No    Review of Systems  Constitutional: No fever/chills. Eyes: No visual changes. ENT: Positive for dentalgia.  No sore throat. Cardiovascular: Denies chest pain. Respiratory: Denies shortness of breath. Gastrointestinal: No abdominal pain.  No nausea, no vomiting.  No diarrhea.  No constipation. Genitourinary: Negative for dysuria. Musculoskeletal: Negative for back pain. Skin: Negative for rash. Neurological: Negative for headaches, focal weakness or numbness.   ____________________________________________   PHYSICAL EXAM:  VITAL SIGNS: ED Triage Vitals  Enc Vitals Group     BP 08/21/17 0138 (!) 160/100     Pulse Rate 08/21/17 0138 65     Resp 08/21/17 0138 18     Temp 08/21/17 0138 98.4 F (36.9 C)     Temp Source 08/21/17 0138 Oral     SpO2 08/21/17 0138 100 %     Weight 08/21/17 0138 225 lb (102.1 kg)     Height 08/21/17 0138 5\' 9"  (1.753 m)     Head Circumference --      Peak Flow --      Pain Score 08/21/17 0137 10  Pain Loc --      Pain Edu? --      Excl. in GC? --     Constitutional: Alert and oriented. Well appearing and in no acute distress. Eyes: Conjunctivae are normal. PERRL. EOMI. Head: Atraumatic. Nose: No congestion/rhinnorhea. Mouth/Throat: Dental cement noted to left lower molar #36.  There is no intra-or extraoral swelling.  No abscess noted.   Neck: No stridor.   Cardiovascular: Normal rate, regular rhythm. Grossly normal heart sounds.  Good peripheral circulation. Respiratory: Normal respiratory effort.  No retractions. Lungs CTAB. Gastrointestinal: Soft and  nontender. No distention. No abdominal bruits. No CVA tenderness. Musculoskeletal: No lower extremity tenderness nor edema.  No joint effusions. Neurologic:  Normal speech and language. No gross focal neurologic deficits are appreciated. No gait instability. Skin:  Skin is warm, dry and intact. No rash noted. Psychiatric: Mood and affect are normal. Speech and behavior are normal.  ____________________________________________   LABS (all labs ordered are listed, but only abnormal results are displayed)  Labs Reviewed - No data to display ____________________________________________  EKG  None ____________________________________________  RADIOLOGY  No results found.  ____________________________________________   PROCEDURES  Procedure(s) performed: None  Procedures  Critical Care performed: No  ____________________________________________   INITIAL IMPRESSION / ASSESSMENT AND PLAN / ED COURSE  As part of my medical decision making, I reviewed the following data within the electronic MEDICAL RECORD NUMBER Nursing notes reviewed and incorporated and Notes from prior ED visits   36 year old female who presents with dental pain secondary to feeling falling out.  She has temporary dental cement in place. There is no evidence of abscess or infection.  Will treat with NSAIDs, analgesia and she will follow-up with her dentist this week.  Strict return precautions given.  Patient and spouse verbalize understanding and agree with plan of care.      ____________________________________________   FINAL CLINICAL IMPRESSION(S) / ED DIAGNOSES  Final diagnoses:  Dentalgia  Dental filling status     ED Discharge Orders        Ordered    ibuprofen (ADVIL,MOTRIN) 800 MG tablet  Every 8 hours PRN     08/21/17 0407    oxyCODONE-acetaminophen (ROXICET) 5-325 MG tablet  Every 4 hours PRN     08/21/17 0407       Note:  This document was prepared using Dragon voice recognition  software and may include unintentional dictation errors.    Irean HongSung, Meldrick Buttery J, MD 08/21/17 [phone]

## 2017-08-21 NOTE — Discharge Instructions (Signed)
1.  You may take pain medicines as needed (Motrin/Percocet #15). 2.  Return to the ER for worsening symptoms, persistent vomiting, facial swelling or other concerns.

## 2017-08-28 ENCOUNTER — Emergency Department
Admission: EM | Admit: 2017-08-28 | Discharge: 2017-08-28 | Disposition: A | Discharge status: Home / Self Care | Payer: 59 | Attending: Emergency Medicine | Admitting: Emergency Medicine

## 2017-08-28 ENCOUNTER — Encounter: Payer: Self-pay | Admitting: Emergency Medicine

## 2017-08-28 DIAGNOSIS — Z79899 Other long term (current) drug therapy: Secondary | ICD-10-CM | POA: Insufficient documentation

## 2017-08-28 DIAGNOSIS — F17210 Nicotine dependence, cigarettes, uncomplicated: Secondary | ICD-10-CM | POA: Insufficient documentation

## 2017-08-28 DIAGNOSIS — K0889 Other specified disorders of teeth and supporting structures: Secondary | ICD-10-CM | POA: Diagnosis not present

## 2017-08-28 MED ORDER — OXYCODONE-ACETAMINOPHEN 5-325 MG PO TABS: 1.0000 | ORAL_TABLET | Freq: Four times a day (QID) | ORAL | 0 refills | Status: AC | PRN

## 2017-08-28 NOTE — Discharge Instructions (Signed)
OPTIONS FOR DENTAL FOLLOW UP CARE ° °[place] Department of Health and Human Services - Local Safety Net Dental Clinics °http://www.ncdhhs.gov/dph/oralhealth/services/safetynetclinics.htm °  °[facility] (336-562-3123) ° °[facility] [place] (919-933-9087) ° °[facility] (919-663-1744 ext [phone]) ° °[facility] ([phone]) ° °[facility] ([phone]) °This clinic caters to the indigent population and is on a lottery system. °Location: °[facility], [address], [place] °Clinic Hours: °Wednesdays from 6pm - 9pm, patients seen by a lottery system. °For dates, call or go to [email or web address].[facility].edu/[facility]/patients/Dental-[facility] °Services: °Cleanings, fillings and simple extractions. °Payment Options: °DENTAL WORK IS FREE OF CHARGE. Bring proof of income or support. °Best way to get seen: °Arrive at 5:15 pm - this is a lottery, NOT first come/first serve, so arriving earlier will not increase your chances of being seen. °  °  °[facility] °919-537-3737 °Select option 1 for emergencies °  °Location: °[facility], [address], [place] °Clinic Hours: °No walk-ins accepted - call the day before to schedule an appointment. °Check in times are 9:30 am and 1:30 pm. °Services: °Simple extractions, temporary fillings, pulpectomy/pulp debridement, uncomplicated abscess drainage. °Payment Options: °PAYMENT IS DUE AT THE TIME OF SERVICE.  Fee is usually $100-200, additional surgical procedures (e.g. abscess drainage) may be extra. °Cash, checks, Visa/MasterCard accepted.  Can file Medicaid if patient is covered for dental - patient should call case worker to check. °No discount for [facility] patients. °Best way to get seen: °MUST call the day before and get onto the schedule. Can usually be seen the next 1-2 days. No walk-ins accepted. °  °  °[facility] °[phone] °   °Location: °[facility], [address], [place] °Clinic Hours: °M, W, Th, F 8am or 1:30pm, Tues 9a or 1:30 - first come/first served. °Services: °Simple extractions, temporary fillings, uncomplicated abscess drainage.  You do not need to be an [place] resident. °Payment Options: °PAYMENT IS DUE AT THE TIME OF SERVICE. °Dental insurance, otherwise sliding scale - bring proof of income or support. °Depending on income and treatment needed, cost is usually $50-200. °Best way to get seen: °Arrive early as it is first come/first served. °  °  °[facility] °[phone] °  °Location: °[address] °Clinic Hours: °Mon-Thu 8a-5p °Services: °Most basic dental services including extractions and fillings. °Payment Options: °PAYMENT IS DUE AT THE TIME OF SERVICE. °Sliding scale, up to 50% off - bring proof if income or support. °Medicaid with dental option accepted. °Best way to get seen: °Call to schedule an appointment, can usually be seen within 2 weeks OR they will try to see walk-ins - show up at 8a or 2p (you may have to wait). °  °  °[facility] °919-245-2435 °[place] RESIDENTS ONLY °  °Location: °[facility], [address], [place],  [address] °Clinic Hours: By appointment only. °Monday - Thursday 8am-5pm, Friday 8am-12pm °Services: Cleanings, fillings, extractions. °Payment Options: °PAYMENT IS DUE AT THE TIME OF SERVICE. °Cash, Visa or MasterCard. Sliding scale - $30 minimum per service. °Best way to get seen: °Come in to office, complete packet and make an appointment - need proof of income °or support monies for each household member and proof of [place] residence. °Usually takes about a month to get in. °  °  °[facility] °[phone] °  °Location: °[address]   [place] °Clinic Hours: Walk-in Urgent Care Dental Services are offered Monday-Friday  mornings only. °The numbers of emergencies accepted daily is limited to the number of °providers available. °Maximum 15 - Mondays, Wednesdays & Thursdays °Maximum 10 - Tuesdays & Fridays °Services: °You do not need to be a [place] resident to be seen for a dental emergency. °Emergencies are defined as pain, swelling, abnormal bleeding, or dental trauma. Walkins will receive x-rays if needed. °NOTE: Dental cleaning is not an emergency. °Payment Options: °PAYMENT IS DUE AT THE TIME OF SERVICE. °Minimum co-pay is $40.00 for uninsured patients. °Minimum co-pay is $3.00 for Medicaid with dental coverage. °Dental Insurance is accepted and must be presented at time of visit. °Medicare does not cover dental. °Forms of payment: Cash, credit card, checks. °Best way to get seen: °If not previously registered with the clinic, walk-in dental registration begins at 7:15 am and is on a first come/first serve basis. °If previously registered with the clinic, call to make an appointment. °  °  °[facility] °919-776-4359 °[place] RESIDENTS ONLY °  °Location: °[address], [place], [place] °Clinic Hours: °Mon-Thu 10a-2p °Services: Extractions only! °Payment Options: °FREE (donations accepted) - bring proof of income or support °Best way to get seen: °Call and schedule an appointment OR come at 8am on the 1st Monday of every month (except for holidays) when it is first come/first served. °  °  °[facility] °[phone] °  °Location: °[address], [place] °Clinic Hours: °Friday mornings °Services, Payment Options, Best way to get seen: °Call for info °

## 2017-08-28 NOTE — ED Provider Notes (Signed)
[facility] Emergency Department Provider Note  ____________________________________________  Time seen: Approximately 10:37 PM  I have reviewed the triage vital signs and the nursing notes.   HISTORY  Chief Complaint Dental Pain    HPI Kaitlyn Hall is a 37 y.o. female that presents to the emergency department for evaluation of left lower dental pain for 1 week.  She states that the tooth is fractured and she has over-the-counter filling in place.  She is currently taking Augmentin for 2 days for infection.  [facility].  She states that she is not able to tolerate the pain until then.  No fever, chills, swelling, nausea, vomiting, abdominal pain.  History reviewed. No pertinent past medical history.  There are no active problems to display for this patient.   Past Surgical History:  Procedure Laterality Date  . CESAREAN SECTION    . TUBAL LIGATION      Prior to Admission medications   Medication Sig Start Date End Date Taking? Authorizing Provider  chlorpheniramine-HYDROcodone (TUSSIONEX PENNKINETIC ER) 10-8 MG/5ML SUER Take 5 mLs by mouth 2 (two) times daily. 04/23/17   Arnaldo NatalMalinda, Paul F, MD  ibuprofen (ADVIL,MOTRIN) 800 MG tablet Take 1 tablet (800 mg total) by mouth every 8 (eight) hours as needed for moderate pain. 08/21/17   Irean HongSung, Jade J, MD  oxyCODONE-acetaminophen (ROXICET) 5-325 MG tablet Take 1 tablet by mouth every 6 (six) hours as needed for up to 3 days. 08/28/17 08/31/17  Enid DerryWagner, Brittnye Josephs, PA-C  predniSONE (DELTASONE) 20 MG tablet Take 1 tablet (20 mg total) by mouth daily. 04/23/17 04/23/18  Arnaldo NatalMalinda, Paul F, MD  sulfamethoxazole-trimethoprim (BACTRIM DS,SEPTRA DS) 800-160 MG per tablet Take 1 tablet by mouth 2 (two) times daily. 02/25/15   Tommi RumpsSummers, Rhonda L, PA-C  SUMAtriptan (IMITREX) 50 MG tablet Take 1 tablet (50 mg total) by mouth once as needed for migraine. May repeat in 2 hours if  headache persists or recurs. 03/24/16 03/25/17  Emily FilbertWilliams, Jonathan E, MD    Allergies Codeine  No family history on file.  Social History Social History   Tobacco Use  . Smoking status: Current Every Day Smoker    Packs/day: 1.00    Types: Cigarettes  . Smokeless tobacco: Never Used  Substance Use Topics  . Alcohol use: Yes    Comment: occasionally  . Drug use: No     Review of Systems  Constitutional: No fever/chills Cardiovascular: No chest pain. Respiratory: No SOB. Gastrointestinal: No abdominal pain.  No nausea, no vomiting.  Musculoskeletal: Negative for musculoskeletal pain. Skin: Negative for rash, abrasions, lacerations, ecchymosis.   ____________________________________________   PHYSICAL EXAM:  VITAL SIGNS: ED Triage Vitals  Enc Vitals Group     BP 08/28/17 1949 (!) 168/102     Pulse Rate 08/28/17 1949 78     Resp 08/28/17 1949 20     Temp 08/28/17 1949 98.3 F (36.8 C)     Temp Source 08/28/17 1949 Oral     SpO2 08/28/17 1949 100 %     Weight 08/28/17 1950 225 lb (102.1 kg)     Height 08/28/17 1950 5\' 9"  (1.753 m)     Head Circumference --      Peak Flow --      Pain Score 08/28/17 1949 10     Pain Loc --      Pain Edu? --      Excl. in GC? --  Constitutional: Alert and oriented. Well appearing and in no acute distress. Eyes: Conjunctivae are normal. PERRL. EOMI. Head: Atraumatic. ENT:      Ears:      Nose: No congestion/rhinnorhea.      Mouth/Throat: Mucous membranes are moist.  Large fracture with filling in left back lower molar.  No drainage.  No visible swelling. Neck: No stridor.  Cardiovascular: Normal rate, regular rhythm.  Good peripheral circulation. Respiratory: Normal respiratory effort without tachypnea or retractions. Lungs CTAB. Good air entry to the bases with no decreased or absent breath sounds. Musculoskeletal: Full range of motion to all extremities. No gross deformities appreciated. Neurologic:  Normal speech and  language. No gross focal neurologic deficits are appreciated.  Skin:  Skin is warm, dry and intact. No rash noted.   ____________________________________________   LABS (all labs ordered are listed, but only abnormal results are displayed)  Labs Reviewed - No data to display ____________________________________________  EKG   ____________________________________________  RADIOLOGY  No results found.  ____________________________________________    PROCEDURES  Procedure(s) performed:    Procedures    Medications - No data to display   ____________________________________________   INITIAL IMPRESSION / ASSESSMENT AND PLAN / ED COURSE  Pertinent labs & imaging results that were available during my care of the patient were reviewed by me and considered in my medical decision making (see chart for details).  Review of the [place] CSRS was performed in accordance of the [facility] prior to dispensing any controlled drugs.   Patient's diagnosis is consistent with dental fracture.  Vital signs and exam are reassuring.  Patient has an appointment with dentist at 9 AM on Saturday.  She is currently taking Augmentin for infection.  Patient will be discharged home with prescriptions for a 2-day course of Percocet. Patient is to follow up with dentist as directed. Patient is given ED precautions to return to the ED for any worsening or new symptoms.     ____________________________________________  FINAL CLINICAL IMPRESSION(S) / ED DIAGNOSES  Final diagnoses:  Pain, dental      NEW MEDICATIONS STARTED DURING THIS VISIT:  ED Discharge Orders        Ordered    oxyCODONE-acetaminophen (ROXICET) 5-325 MG tablet  Every 6 hours PRN     08/28/17 2216          This chart was dictated using voice recognition software/Dragon. Despite best efforts to proofread, errors can occur which can change the meaning. Any change was purely unintentional.    Enid Derry,  PA-C 08/28/17 2241    Minna Antis, MD 08/28/17 2316

## 2017-08-28 NOTE — ED Triage Notes (Signed)
Pt comes into the ED via POV c/o LLQ dental pain that has been an ongoing issue.  Patient has dental appt on Saturday but states she will not be able to make it that long due to the pain.  Patient was seen here earlier in the week and given amoxicillin, pain medication, and chlorhexidine rinse.  Patient has had unrelieved pain and is out of pain medication.  Patient states she has been put on Prilosec due to an ulcer that has formed from all the NSAIDS she has tried to control the pain with.  Patient has even and unlabored respirations at this time.

## 2018-01-07 ENCOUNTER — Ambulatory Visit (INDEPENDENT_AMBULATORY_CARE_PROVIDER_SITE_OTHER): Payer: 59 | Admitting: Obstetrics and Gynecology

## 2018-01-07 ENCOUNTER — Encounter: Payer: Self-pay | Admitting: Obstetrics and Gynecology

## 2018-01-07 DIAGNOSIS — Z113 Encounter for screening for infections with a predominantly sexual mode of transmission: Secondary | ICD-10-CM

## 2018-01-07 DIAGNOSIS — N939 Abnormal uterine and vaginal bleeding, unspecified: Secondary | ICD-10-CM

## 2018-01-07 DIAGNOSIS — Z1231 Encounter for screening mammogram for malignant neoplasm of breast: Secondary | ICD-10-CM

## 2018-01-07 DIAGNOSIS — Z1239 Encounter for other screening for malignant neoplasm of breast: Secondary | ICD-10-CM

## 2018-01-07 DIAGNOSIS — Z124 Encounter for screening for malignant neoplasm of cervix: Secondary | ICD-10-CM

## 2018-01-07 DIAGNOSIS — Z01419 Encounter for gynecological examination (general) (routine) without abnormal findings: Secondary | ICD-10-CM | POA: Diagnosis not present

## 2018-01-07 NOTE — Patient Instructions (Signed)
Preventive Care 18-39 Years, Female Preventive care refers to lifestyle choices and visits with your health care provider that can promote health and wellness. What does preventive care include?  A yearly physical exam. This is also called an annual well check.  Dental exams once or twice a year.  Routine eye exams. Ask your health care provider how often you should have your eyes checked.  Personal lifestyle choices, including: ? Daily care of your teeth and gums. ? Regular physical activity. ? Eating a healthy diet. ? Avoiding tobacco and drug use. ? Limiting alcohol use. ? Practicing safe sex. ? Taking vitamin and mineral supplements as recommended by your health care provider. What happens during an annual well check? The services and screenings done by your health care provider during your annual well check will depend on your age, overall health, lifestyle risk factors, and family history of disease. Counseling Your health care provider may ask you questions about your:  Alcohol use.  Tobacco use.  Drug use.  Emotional well-being.  Home and relationship well-being.  Sexual activity.  Eating habits.  Work and work Statistician.  Method of birth control.  Menstrual cycle.  Pregnancy history.  Screening You may have the following tests or measurements:  Height, weight, and BMI.  Diabetes screening. This is done by checking your blood sugar (glucose) after you have not eaten for a while (fasting).  Blood pressure.  Lipid and cholesterol levels. These may be checked every 5 years starting at age 66.  Skin check.  Hepatitis C blood test.  Hepatitis B blood test.  Sexually transmitted disease (STD) testing.  BRCA-related cancer screening. This may be done if you have a family history of breast, ovarian, tubal, or peritoneal cancers.  Pelvic exam and Pap test. This may be done every 3 years starting at age 40. Starting at age 59, this may be done every 5  years if you have a Pap test in combination with an HPV test.  Discuss your test results, treatment options, and if necessary, the need for more tests with your health care provider. Vaccines Your health care provider may recommend certain vaccines, such as:  Influenza vaccine. This is recommended every year.  Tetanus, diphtheria, and acellular pertussis (Tdap, Td) vaccine. You may need a Td booster every 10 years.  Varicella vaccine. You may need this if you have not been vaccinated.  HPV vaccine. If you are 69 or younger, you may need three doses over 6 months.  Measles, mumps, and rubella (MMR) vaccine. You may need at least one dose of MMR. You may also need a second dose.  Pneumococcal 13-valent conjugate (PCV13) vaccine. You may need this if you have certain conditions and were not previously vaccinated.  Pneumococcal polysaccharide (PPSV23) vaccine. You may need one or two doses if you smoke cigarettes or if you have certain conditions.  Meningococcal vaccine. One dose is recommended if you are age 27-21 years and a first-year college student living in a residence hall, or if you have one of several medical conditions. You may also need additional booster doses.  Hepatitis A vaccine. You may need this if you have certain conditions or if you travel or work in places where you may be exposed to hepatitis A.  Hepatitis B vaccine. You may need this if you have certain conditions or if you travel or work in places where you may be exposed to hepatitis B.  Haemophilus influenzae type b (Hib) vaccine. You may need this if  you have certain risk factors.  Talk to your health care provider about which screenings and vaccines you need and how often you need them. This information is not intended to replace advice given to you by your health care provider. Make sure you discuss any questions you have with your health care provider. Document Released: 10/09/2001 Document Revised: 05/02/2016  Document Reviewed: 06/14/2015 Elsevier Interactive Patient Education  [employer].

## 2018-01-07 NOTE — Progress Notes (Signed)
Patient ID: Kaitlyn Hall, female   DOB: 07/11/1981, 37 y.o.   MRN: [id number]     Gynecology Annual Exam   [facility]  Chief Complaint:  Chief Complaint  Patient presents with  . Gynecologic Exam    Pt request all STD testing  . Pelvic Pain    pain before cycles start    History of Present Illness: Patient is a 37 y.o. [name] presents for annual exam. The patient has no complaints today.   LMP: Patient's last menstrual period was 12/17/2017. Average Interval: {28 days Duration of flow: 5-7 days Heavy Menses: yes Clots: yes Intermenstrual Bleeding: no Postcoital Bleeding: no Dysmenorrhea: no  The patient is sexually active. She currently uses tubal ligation for contraception. She denies dyspareunia.  The patient does perform self breast exams.  There is no notable family history of breast or ovarian cancer in her family.  The patient wears seatbelts: yes.   The patient has regular exercise: not asked.    The patient denies current symptoms of depression.    Review of Systems: ROS  Past Medical History:  No past medical history on file.  Past Surgical History:  Past Surgical History:  Procedure Laterality Date  . CESAREAN SECTION  2010  . TUBAL LIGATION  2010    Gynecologic History:  Patient's last menstrual period was 12/17/2017.  Obstetric History: Kaitlyn Hall  Family History:  No family history on file.  Social History:  Social History   Socioeconomic History  . Marital status: Single    Spouse name: Not on file  . Number of children: Not on file  . Years of education: Not on file  . Highest education level: Not on file  Occupational History  . Not on file  Social Needs  . Financial resource strain: Not on file  . Food insecurity:    Worry: Not on file    Inability: Not on file  . Transportation needs:    Medical: Not on file    Non-medical: Not on file  Tobacco Use  . Smoking status: Current Every Day Smoker   Packs/day: 1.00    Types: Cigarettes  . Smokeless tobacco: Never Used  Substance and Sexual Activity  . Alcohol use: Yes    Comment: occasionally  . Drug use: No  . Sexual activity: Yes    Birth control/protection: Surgical  Lifestyle  . Physical activity:    Days per week: 1 day    Minutes per session: 30 min  . Stress: Rather much  Relationships  . Social connections:    Talks on phone: Not on file    Gets together: Not on file    Attends religious service: Not on file    Active member of club or organization: Not on file    Attends meetings of clubs or organizations: Not on file    Relationship status: Not on file  . Intimate partner violence:    Fear of current or ex partner: Not on file    Emotionally abused: Not on file    Physically abused: Not on file    Forced sexual activity: Not on file  Other Topics Concern  . Not on file  Social History Narrative  . Not on file    Allergies:  Allergies  Allergen Reactions  . Codeine Hives    Medications: Prior to Admission medications   Medication Sig Start Date End Date Taking? Authorizing Provider  ibuprofen (ADVIL,MOTRIN) 800 MG tablet Take 1 tablet (800 mg  total) by mouth every 8 (eight) hours as needed for moderate pain. 08/21/17  Yes Irean Hong, MD  SUMAtriptan (IMITREX) 50 MG tablet Take 1 tablet (50 mg total) by mouth once as needed for migraine. May repeat in 2 hours if headache persists or recurs. Patient not taking: Reported on 01/07/2018 03/24/16 03/25/17  Emily Filbert, MD    Physical Exam Vitals: Blood pressure 130/88, pulse 86, height  (1.753 m), weight 224 lb (101.6 kg), last menstrual period 12/17/2017.  General: NAD HEENT: normocephalic, anicteric Thyroid: no enlargement, no palpable nodules Pulmonary: No increased work of breathing, CTAB Cardiovascular: RRR, distal pulses 2+ Breast: Breast symmetrical, no tenderness, no palpable nodules or masses, no skin or nipple retraction present,  no nipple discharge.  No axillary or supraclavicular lymphadenopathy. Abdomen: NABS, soft, non-tender, non-distended.  Umbilicus without lesions.  No hepatomegaly, splenomegaly or masses palpable. No evidence of hernia  Genitourinary:  External: Normal external female genitalia.  Normal urethral meatus, normal Bartholin's and Skene's glands.    Vagina: Normal vaginal mucosa, no evidence of prolapse.    Cervix: Grossly normal in appearance, no bleeding  Uterus: Non-enlarged, mobile,  No CMT, small 2cm anterior lower uterine segment fibroid?  Adnexa: ovaries non-enlarged, no adnexal masses  Rectal: deferred  Lymphatic: no evidence of inguinal lymphadenopathy Extremities: no edema, erythema, or tenderness Neurologic: Grossly intact Psychiatric: mood appropriate, affect full Female chaperone present for pelvic and breast  portions of the physical exam    Assessment: 37 y.o. [id number] routine annual exam  Plan: Problem List Items Addressed This Visit    None    Visit Diagnoses    Screening for malignant neoplasm of cervix       Relevant Orders   PapIG, CtNgTv, HPV, rfx 16/18   Breast screening       Encounter for gynecological examination without abnormal finding       Relevant Orders   PapIG, CtNgTv, HPV, rfx 16/18   HEP, RPR, HIV Panel (Completed)   Routine screening for STI (sexually transmitted infection)       Relevant Orders   PapIG, CtNgTv, HPV, rfx 16/18   HEP, RPR, HIV Panel (Completed)   Abnormal uterine bleeding (AUB)       Relevant Orders   US Transvaginal Non-OB      2) STI screening  wasoffered and accepted  2)  ASCCP guidelines and rational discussed.  Patient opts for every 3 years screening interval  3) Contraception - the patient is currently using  tubal ligation.  She is happy with her current form of contraception and plans to continue.  However, given heavier menstrual cycles discussed hormonal options for cycle control.  Decision to be made based on  ultrasound findings  4) Routine healthcare maintenance including cholesterol, diabetes screening discussed managed by PCP  5) Return in 1 week (on 01/14/2018) for TVUS and follow up.   Vena Austria, MD, [facility], [facility] 01/07/2018, 4:00 PM

## 2018-01-08 ENCOUNTER — Encounter: Payer: Self-pay | Admitting: Obstetrics and Gynecology

## 2018-01-08 ENCOUNTER — Encounter (INDEPENDENT_AMBULATORY_CARE_PROVIDER_SITE_OTHER): Payer: Self-pay

## 2018-01-08 LAB — HEP, RPR, HIV PANEL
HIV SCREEN 4TH GENERATION: NONREACTIVE
Hepatitis B Surface Ag: NEGATIVE
RPR Ser Ql: NONREACTIVE

## 2018-01-10 ENCOUNTER — Other Ambulatory Visit: Payer: Self-pay | Admitting: Obstetrics and Gynecology

## 2018-01-10 ENCOUNTER — Encounter (INDEPENDENT_AMBULATORY_CARE_PROVIDER_SITE_OTHER): Payer: Self-pay

## 2018-01-10 LAB — PAPIG, CTNGTV, HPV, RFX 16/18
Chlamydia, Nuc. Acid Amp: NEGATIVE
Gonococcus, Nuc. Acid Amp: NEGATIVE
PAP SMEAR COMMENT: 0
Trich vag by NAA: NEGATIVE

## 2018-01-10 LAB — HPV, LOW VOLUME (REFLEX): HPV low volume reflex: NEGATIVE

## 2018-01-13 ENCOUNTER — Other Ambulatory Visit: Payer: Self-pay

## 2018-01-13 MED ORDER — IBUPROFEN 800 MG PO TABS: 800.0000 mg | ORAL_TABLET | Freq: Three times a day (TID) | ORAL | 0 refills | Status: DC | PRN

## 2018-01-13 NOTE — Telephone Encounter (Signed)
Pt received msg that rx was sent, but pharmacy has not received. Pt also inquiring about meds to help control nausea with periods. [facility]. Cb#[phone]

## 2018-01-14 ENCOUNTER — Other Ambulatory Visit: Payer: Self-pay

## 2018-01-14 ENCOUNTER — Encounter: Payer: Self-pay | Admitting: Obstetrics and Gynecology

## 2018-01-14 MED ORDER — IBUPROFEN 800 MG PO TABS: 800.0000 mg | ORAL_TABLET | Freq: Three times a day (TID) | ORAL | 0 refills | Status: DC | PRN

## 2018-01-17 ENCOUNTER — Other Ambulatory Visit: Payer: 59

## 2018-01-17 ENCOUNTER — Ambulatory Visit: Payer: 59 | Admitting: Obstetrics and Gynecology

## 2018-02-07 ENCOUNTER — Ambulatory Visit (INDEPENDENT_AMBULATORY_CARE_PROVIDER_SITE_OTHER): Payer: 59 | Admitting: Obstetrics and Gynecology

## 2018-02-07 ENCOUNTER — Encounter: Payer: Self-pay | Admitting: Obstetrics and Gynecology

## 2018-02-07 ENCOUNTER — Ambulatory Visit (INDEPENDENT_AMBULATORY_CARE_PROVIDER_SITE_OTHER): Payer: 59

## 2018-02-07 VITALS — BP 120/72 | HR 83 | Ht 69.0 in | Wt 228.0 lb

## 2018-02-07 DIAGNOSIS — N939 Abnormal uterine and vaginal bleeding, unspecified: Secondary | ICD-10-CM | POA: Diagnosis not present

## 2018-02-07 DIAGNOSIS — R102 Pelvic and perineal pain: Secondary | ICD-10-CM

## 2018-02-07 DIAGNOSIS — N83202 Unspecified ovarian cyst, left side: Secondary | ICD-10-CM | POA: Diagnosis not present

## 2018-02-07 DIAGNOSIS — N946 Dysmenorrhea, unspecified: Secondary | ICD-10-CM

## 2018-02-07 NOTE — Progress Notes (Signed)
Gynecology Ultrasound Follow Up  Chief Complaint:  Chief Complaint  Patient presents with  . Follow-up    GYN U/S     History of Present Illness: Patient is a 37 y.o. female who presents today for ultrasound evaluation of pelvic pain and dysmenorrhea.  Ultrasound demonstrates the following findgins Adnexa:  Normal right ovary, complex cystic structure left ovary Uterus: Non-enlarged with area of indistinct endometrial stripe Additional: no free fluid  Review of Systems: Review of Systems  Constitutional: Negative.   Gastrointestinal: Positive for abdominal pain. Negative for blood in stool, constipation, diarrhea, heartburn, melena, nausea and vomiting.  Genitourinary: Negative.     Past Medical History:  No past medical history on file.  Past Surgical History:  Past Surgical History:  Procedure Laterality Date  . CESAREAN SECTION  2010  . TUBAL LIGATION  2010    Gynecologic History:  Patient's last menstrual period was 01/17/2018 (approximate).  Family History:  No family history on file.  Social History:  Social History   Socioeconomic History  . Marital status: Single    Spouse name: Not on file  . Number of children: Not on file  . Years of education: Not on file  . Highest education level: Not on file  Occupational History  . Not on file  Social Needs  . Financial resource strain: Not on file  . Food insecurity:    Worry: Not on file    Inability: Not on file  . Transportation needs:    Medical: Not on file    Non-medical: Not on file  Tobacco Use  . Smoking status: Current Every Day Smoker    Packs/day: 1.00    Types: Cigarettes  . Smokeless tobacco: Never Used  Substance and Sexual Activity  . Alcohol use: Yes    Comment: occasionally  . Drug use: No  . Sexual activity: Yes    Birth control/protection: Surgical  Lifestyle  . Physical activity:    Days per week: 1 day    Minutes per session: 30 min  . Stress: Rather much    Relationships  . Social connections:    Talks on phone: Not on file    Gets together: Not on file    Attends religious service: Not on file    Active member of club or organization: Not on file    Attends meetings of clubs or organizations: Not on file    Relationship status: Not on file  . Intimate partner violence:    Fear of current or ex partner: Not on file    Emotionally abused: Not on file    Physically abused: Not on file    Forced sexual activity: Not on file  Other Topics Concern  . Not on file  Social History Narrative  . Not on file    Allergies:  Allergies  Allergen Reactions  . Codeine Hives    Medications: Prior to Admission medications   Medication Sig Start Date End Date Taking? Authorizing Provider  ibuprofen (ADVIL,MOTRIN) 800 MG tablet Take 1 tablet (800 mg total) by mouth every 8 (eight) hours as needed for moderate pain. 01/14/18  Yes Vena Austria, MD  SUMAtriptan (IMITREX) 50 MG tablet Take 1 tablet (50 mg total) by mouth once as needed for migraine. May repeat in 2 hours if headache persists or recurs. Patient not taking: Reported on 01/07/2018 03/24/16 03/25/17  Emily Filbert, MD    Physical Exam Vitals: Blood pressure 120/72, pulse 83, height 5\' 9"  (1.753  m), weight 228 lb (103.4 kg), last menstrual period 01/17/2018.  General: NAD HEENT: normocephalic, anicteric Pulmonary: No increased work of breathing Extremities: no edema, erythema, or tenderness Neurologic: Grossly intact, normal gait Psychiatric: mood appropriate, affect full  [place] Transvaginal Non-ob  Result Date: 02/10/2018 ULTRASOUND REPORT Patient Name: Kaitlyn Hall DOB: [birth year] MRN: [id number] Location: [facility] Date of Service: 02/07/2018 Indications:AUB Findings: The uterus is anteverted and measures 9.04 x 7.28 x 3.81cm. Echo texture is heterogenous without evidence of focal masses. The Endometrium measures 4.61 mm. Right Ovary measures 2.55 x 1.61 x 1.54 cm. It is  normal in appearance. Left Ovary measures 3.76 x 3.07 x  2.73 cm. It is not normal in appearance. - complex cystic mass, possible hemorrhagic cyst Survey of the adnexa demonstrates no adnexal masses. There is no free fluid in the cul de sac. Impression: 1. Heterogeneous uterus suspicious for Adenomyosis 2. Left hemorrhagic ovarian cyst Recommendations: 1.Clinical correlation with the patient's History and Physical Exam. Willette AlmaKristen Priestley, RDMS, RVT Images reviewed.  Small hemorrhagic cyst vs endometrioma in the left ovary.  In addition endometrial myometrial interface indistinct in area consistent with possible adenomyosis  Vena AustriaAndreas Kamrin Sibley, MD, Merlinda FrederickFACOG [facility], [facility]    Assessment: 37 y.o. [phone] AUB, dysmenorrhea, pelvic pain, and left ovarian cyst  Plan: Problem List Items Addressed This Visit    None    Visit Diagnoses    Left ovarian cyst    -  Primary   Abnormal uterine bleeding       Dysmenorrhea       Acute pelvic pain, female          1) I have had a careful discussion with this patient about all the options available and the risk/benefits of each. I have fully informed this patient that a laparoscopy may subject her to a variety of discomforts and risks: She understands that most patients have surgery with little difficulty, but problems can happen ranging from minor to fatal. These include nausea, vomiting, pain, bleeding, infection, poor healing, hernia, or formation of adhesions. Unexpected reactions may occur from any drug or anesthetic given. Unintended injury may occur to other pelvic or abdominal structures such as Fallopian tubes, ovaries, bladder, ureter (tube from kidney to bladder), or bowel. Nerves going from the pelvis to the legs may be injured. Any such injury may require immediate or later additional surgery to correct the problem. Excessive blood loss requiring transfusion is very unlikely but possible. Dangerous blood clots may form in the legs  or lungs. Physical and sexual activity will be restricted in varying degrees for an indeterminate period of time but most often 2-4 weeks. She understands that the plan is to do this laparoscopically, however, there is a chance that this will need to be performed via a larger incision. Finally, she understands that it is impossible to list every possible undesirable effect and that the condition for which surgery is done is not always cured or significantly improved, and in rare cases may be even worsen. Ample time was given to answer all questions.  2) Given length of symptoms suspect imaging findings are more likely to represent endometriomas as opposed to hemorrhagic cyst.  We discussed empiric treatment vs establishing definitive diagnosis with laparoscopy.  Patient interested in undergoing laparoscopy - in the meantime will start or norethindrone  3) Return in about 1 week (around 02/14/2018), or if symptoms worsen or fail to improve, Kaitlyn Hall will call to schedule surgery, for NOB and viability  scan.    Vena Austria, MD, [facility], [facility]

## 2018-02-10 ENCOUNTER — Telehealth: Payer: Self-pay | Admitting: Obstetrics and Gynecology

## 2018-02-10 ENCOUNTER — Encounter: Payer: Self-pay | Admitting: Obstetrics and Gynecology

## 2018-02-10 ENCOUNTER — Other Ambulatory Visit: Payer: Self-pay | Admitting: Obstetrics and Gynecology

## 2018-02-10 MED ORDER — IBUPROFEN 800 MG PO TABS: 800.0000 mg | ORAL_TABLET | Freq: Three times a day (TID) | ORAL | 0 refills | Status: DC | PRN

## 2018-02-10 MED ORDER — NORETHINDRONE ACETATE 5 MG PO TABS: 5.0000 mg | ORAL_TABLET | Freq: Every day | ORAL | 11 refills | Status: DC

## 2018-02-10 NOTE — Telephone Encounter (Signed)
Lmtrc

## 2018-02-10 NOTE — Telephone Encounter (Signed)
Pt was seen on Friday. Please advise

## 2018-02-10 NOTE — Telephone Encounter (Signed)
-----   Message from Vena AustriaAndreas Staebler, MD sent at 02/10/2018 10:50 AM EDT ----- Regarding: Srugery Surgery Date:   LOS: same day surgery  Surgery Booking Request Patient Full Name: Kaitlyn Hall MRN: [id number]  DOB: 17-Oct-1980  Surgeon: Vena AustriaAndreas Staebler, MD  Requested Surgery Date and Time: 1-4 weeks Diagnosis and Code: Left ovarian cyst Secondary Diagnosis and Code: pelvic pain Surgical Procedure: Diagnostic Diagnostic laparoscopy, left ovarian cystectomy  L&D Notification:N/A Admission Status: same day surgery Length of Surgery: 1h Special Case Needs: none H&P:(date) Phone Interview or Office Pre-Admit: phone interview Interpreter: No Language: English Medical Clearance: No Special Scheduling Instructions:

## 2018-02-14 NOTE — Telephone Encounter (Signed)
Patient is aware of [facility] @ 8:10am w/ Dr. Bonney AidStaebler, Pre-admit Testing phone interview to be scheduled, and OR on 03/25/18. Patient is aware [facility] may receive [facility] and [facility]. Patient confirmed [employer], and insurance info was discussed. Ext given.

## 2018-03-13 ENCOUNTER — Telehealth: Payer: Self-pay | Admitting: Obstetrics and Gynecology

## 2018-03-13 NOTE — Telephone Encounter (Signed)
Patient is schedule 03/19/18 with [facility]MS. Patient is wishing to cancel due to death in Family. And needs to reschedule Surgery

## 2018-03-13 NOTE — Telephone Encounter (Signed)
Patient would like to cancel her surgery due to a death in the family. Patient did not want to reschedule right now, and mentioned she thought she had a high deductible and would need to save money. [facility], and the patient's deductible and coinsurance info was given. Patient is aware she can set up a payment plan through [facility] after surgery. Patient will call back when she is ready to reschedule. Phone# and ext given.

## 2018-03-17 ENCOUNTER — Encounter: Payer: 59 | Admitting: Obstetrics and Gynecology

## 2018-03-19 ENCOUNTER — Encounter: Payer: 59 | Admitting: Obstetrics and Gynecology

## 2018-03-19 ENCOUNTER — Inpatient Hospital Stay: Admission: RE | Admit: 2018-03-19 | Payer: 59 | Source: Ambulatory Visit

## 2018-03-25 ENCOUNTER — Encounter: Admission: RE | Payer: Self-pay | Source: Ambulatory Visit

## 2018-03-25 ENCOUNTER — Ambulatory Visit: Admission: RE | Admit: 2018-03-25 | Payer: 59 | Source: Ambulatory Visit | Admitting: Obstetrics and Gynecology

## 2018-03-25 SURGERY — LAPAROSCOPY, DIAGNOSTIC
Anesthesia: Choice | Laterality: Left

## 2018-08-23 ENCOUNTER — Emergency Department
Admission: EM | Admit: 2018-08-23 | Discharge: 2018-08-23 | Disposition: A | Discharge status: Home / Self Care | Payer: 59 | Attending: Emergency Medicine | Admitting: Emergency Medicine

## 2018-08-23 ENCOUNTER — Other Ambulatory Visit: Payer: Self-pay

## 2018-08-23 ENCOUNTER — Encounter: Payer: Self-pay | Admitting: *Deleted

## 2018-08-23 ENCOUNTER — Emergency Department: Payer: 59

## 2018-08-23 DIAGNOSIS — M25572 Pain in left ankle and joints of left foot: Secondary | ICD-10-CM

## 2018-08-23 DIAGNOSIS — F17210 Nicotine dependence, cigarettes, uncomplicated: Secondary | ICD-10-CM | POA: Diagnosis not present

## 2018-08-23 MED ORDER — KETOROLAC TROMETHAMINE 10 MG PO TABS: 10.0000 mg | ORAL_TABLET | Freq: Four times a day (QID) | ORAL | 0 refills | Status: DC | PRN

## 2018-08-23 MED ORDER — KETOROLAC TROMETHAMINE 30 MG/ML IJ SOLN
30.0000 mg | Freq: Once | INTRAMUSCULAR | Status: AC
  Administered 2018-08-23: 30 mg via INTRAMUSCULAR
  Filled 2018-08-23: qty 1

## 2018-08-23 NOTE — ED Triage Notes (Signed)
Pt reports yesterday at work the breakroom door closed on her left ankle. C/o left lateral ankle pain. Last pain med was Tramadol around 2100 last night.

## 2018-08-23 NOTE — ED Provider Notes (Signed)
[facility] Emergency Department Provider Note  ____________________________________________  Time seen: Approximately 7:33 AM  I have reviewed the triage vital signs and the nursing notes.   HISTORY  Chief Complaint Ankle Pain    HPI Kaitlyn Hall is a 37 y.o. female presents emergency department for evaluation of left ankle injury at work yesterday.  Patient states that she was holding several several things in her hand when the break room door shot on her ankle.  She states that the door is very heavy.  She tried to wait to go to urgent care this morning but was unable to sleep last night due to pain.  She has had difficulty putting weight on ankle since injury.  She took 2 tramadol's that she had leftover and 2 ibuprofens last night.  No additional injuries.   History reviewed. No pertinent past medical history.  There are no active problems to display for this patient.   Past Surgical History:  Procedure Laterality Date  . CESAREAN SECTION  2010  . TUBAL LIGATION  2010    Prior to Admission medications   Medication Sig Start Date End Date Taking? Authorizing Provider  ibuprofen (ADVIL,MOTRIN) 800 MG tablet Take 1 tablet (800 mg total) by mouth every 8 (eight) hours as needed for moderate pain. 02/10/18   Vena AustriaStaebler, Andreas, MD  ketorolac (TORADOL) 10 MG tablet Take 1 tablet (10 mg total) by mouth every 6 (six) hours as needed. 08/23/18   Enid DerryWagner, Eilis Chestnutt, PA-C  norethindrone (AYGESTIN) 5 MG tablet Take 1 tablet (5 mg total) by mouth daily. 02/10/18   Vena AustriaStaebler, Andreas, MD  SUMAtriptan (IMITREX) 50 MG tablet Take 1 tablet (50 mg total) by mouth once as needed for migraine. May repeat in 2 hours if headache persists or recurs. Patient not taking: Reported on 01/07/2018 03/24/16 03/25/17  Emily FilbertWilliams, Jonathan E, MD    Allergies Codeine  No family history on file.  Social History Social History   Tobacco Use  . Smoking status: Current Every Day Smoker     Packs/day: 1.00    Types: Cigarettes  . Smokeless tobacco: Never Used  Substance Use Topics  . Alcohol use: Yes    Comment: occasionally  . Drug use: No     Review of Systems  Respiratory: No SOB. Gastrointestinal: No vomiting.  Musculoskeletal: Positive for ankle pain.  Skin: Negative for rash, abrasions, lacerations, ecchymosis.   ____________________________________________   PHYSICAL EXAM:  VITAL SIGNS: ED Triage Vitals  Enc Vitals Group     BP 08/23/18 0646 (!) 156/97     Pulse Rate 08/23/18 0646 77     Resp 08/23/18 0646 16     Temp 08/23/18 0646 98.2 F (36.8 C)     Temp Source 08/23/18 0646 Oral     SpO2 08/23/18 0646 99 %     Weight --      Height --      Head Circumference --      Peak Flow --      Pain Score 08/23/18 0645 9     Pain Loc --      Pain Edu? --      Excl. in GC? --      Constitutional: Alert and oriented. Well appearing and in no acute distress. Eyes: Conjunctivae are normal. PERRL. EOMI. Head: Atraumatic. ENT:      Ears:      Nose: No congestion/rhinnorhea.      Mouth/Throat: Mucous membranes are moist.  Neck: No stridor.  Cardiovascular:  Normal rate, regular rhythm.  Good peripheral circulation.  Palpable dorsalis pedis pulses bilaterally. Respiratory: Normal respiratory effort without tachypnea or retractions. Lungs CTAB. Good air entry to the bases with no decreased or absent breath sounds. Musculoskeletal: Full range of motion to all extremities. No gross deformities appreciated.  Patient points to the lateral malleolus as area of pain.  No swelling or ecchymosis.  Full range of motion of ankle but with pain. Neurologic:  Normal speech and language. No gross focal neurologic deficits are appreciated.  Skin:  Skin is warm, dry and intact. No rash noted. Psychiatric: Mood and affect are normal. Speech and behavior are normal. Patient exhibits appropriate insight and judgement.   ____________________________________________    LABS (all labs ordered are listed, but only abnormal results are displayed)  Labs Reviewed - No data to display ____________________________________________  EKG   ____________________________________________  RADIOLOGY Lexine BatonI, Jacobe Study, personally viewed and evaluated these images (plain radiographs) as part of my medical decision making, as well as reviewing the written report by the radiologist.  Dg Ankle Complete Left  Result Date: 08/23/2018 CLINICAL DATA:  37 year old who sustained a twisting injury to the LEFT ankle yesterday, persistent LATERAL pain. Initial encounter. EXAM: LEFT ANKLE COMPLETE - 3+ VIEW COMPARISON:  None. FINDINGS: No evidence of acute fracture. Ankle mortise intact with well-preserved joint space. Well-preserved bone mineral density. No intrinsic osseous abnormalities. No visible joint effusion. IMPRESSION: Normal examination. Electronically Signed   By: Hulan Saashomas  Lawrence M.D.   On: 08/23/2018 07:25    ____________________________________________    PROCEDURES  Procedure(s) performed:    Procedures    Medications  ketorolac (TORADOL) 30 MG/ML injection 30 mg (30 mg Intramuscular Given 08/23/18 0747)     ____________________________________________   INITIAL IMPRESSION / ASSESSMENT AND PLAN / ED COURSE  Pertinent labs & imaging results that were available during my care of the patient were reviewed by me and considered in my medical decision making (see chart for details).  Review of the [place] CSRS was performed in accordance of the [facility] prior to dispensing any controlled drugs.   Patient presented to the emergency department for evaluation of ankle injury after a door hit patient's ankle yesterday.  Vital signs and exam are reassuring.  Ankle x-ray negative for bony abnormalities.  Exam is unremarkable.  Ankle was Ace wrapped.  Crutches were given.  IM Toradol was given for pain.  Patient will be discharged home with prescriptions for Toradol.  Patient is to follow up with primary care as directed. Patient is given ED precautions to return to the ED for any worsening or new symptoms.     ____________________________________________  FINAL CLINICAL IMPRESSION(S) / ED DIAGNOSES  Final diagnoses:  Acute left ankle pain      NEW MEDICATIONS STARTED DURING THIS VISIT:  ED Discharge Orders         Ordered    ketorolac (TORADOL) 10 MG tablet  Every 6 hours PRN     08/23/18 0758              This chart was dictated using voice recognition software/Dragon. Despite best efforts to proofread, errors can occur which can change the meaning. Any change was purely unintentional.    Enid DerryWagner, Auden Tatar, PA-C 08/23/18 [id number]    Sharman CheekStafford, Phillip, MD 09/01/18 [phone]

## 2018-09-25 ENCOUNTER — Other Ambulatory Visit: Payer: Self-pay | Admitting: Obstetrics and Gynecology

## 2019-04-07 ENCOUNTER — Other Ambulatory Visit: Payer: Self-pay | Admitting: Obstetrics and Gynecology

## 2019-04-07 ENCOUNTER — Telehealth: Payer: Self-pay

## 2019-04-07 DIAGNOSIS — N83202 Unspecified ovarian cyst, left side: Secondary | ICD-10-CM

## 2019-04-07 DIAGNOSIS — R102 Pelvic and perineal pain: Secondary | ICD-10-CM

## 2019-04-07 NOTE — Telephone Encounter (Signed)
Given that its been a year I think we have to start with a repeat ultrasound to see what that cyst has done, and if still present we can proceed with surgery.  Order has been placed.  Anytime in the next 1-2 weeks is fine

## 2019-04-07 NOTE — Telephone Encounter (Signed)
t was supposed to have surgery last year but did not have insurance coverage. Pt called triage today and states she would like the surgery now. But was not sure if she needed another appt. Please advise

## 2019-04-07 NOTE — Telephone Encounter (Signed)
Patient is schedule for 04/17/19

## 2019-04-07 NOTE — Telephone Encounter (Signed)
Please advise 

## 2019-04-17 ENCOUNTER — Encounter: Payer: Self-pay | Admitting: Obstetrics and Gynecology

## 2019-04-17 ENCOUNTER — Ambulatory Visit (INDEPENDENT_AMBULATORY_CARE_PROVIDER_SITE_OTHER): Payer: 59 | Admitting: Obstetrics and Gynecology

## 2019-04-17 ENCOUNTER — Other Ambulatory Visit: Payer: Self-pay

## 2019-04-17 ENCOUNTER — Ambulatory Visit (INDEPENDENT_AMBULATORY_CARE_PROVIDER_SITE_OTHER): Payer: 59

## 2019-04-17 VITALS — BP 130/88 | Wt 228.0 lb

## 2019-04-17 DIAGNOSIS — N83202 Unspecified ovarian cyst, left side: Secondary | ICD-10-CM

## 2019-04-17 DIAGNOSIS — R102 Pelvic and perineal pain: Secondary | ICD-10-CM | POA: Diagnosis not present

## 2019-04-17 DIAGNOSIS — N8311 Corpus luteum cyst of right ovary: Secondary | ICD-10-CM

## 2019-04-17 MED ORDER — NORETHINDRONE ACETATE 5 MG PO TABS: 5.0000 mg | ORAL_TABLET | Freq: Every day | ORAL | 11 refills | Status: DC

## 2019-04-21 NOTE — Progress Notes (Signed)
Gynecology Ultrasound Follow Up  Chief Complaint:  Chief Complaint  Patient presents with  . Follow-up     History of Present Illness: Patient is a 38 y.o. female who presents today for ultrasound evaluation of pelvic pain .  Ultrasound demonstrates the following findgins Adnexa: normal adnexal structures Uterus: Uterus non-enlarged, no fibroids with endometrial stripe 77mm without focal abnormalities Additional: no free fluid  Review of Systems: Review of Systems  Constitutional: Negative.   Gastrointestinal: Positive for abdominal pain.  Genitourinary: Negative.     Past Medical History:  No past medical history on file.  Past Surgical History:  Past Surgical History:  Procedure Laterality Date  . CESAREAN SECTION  2010  . TUBAL LIGATION  2010    Gynecologic History:  Patient's last menstrual period was 03/28/2019 (exact date). Last Pap: 01/07/2018 Results were: .no abnormalities  Family History:  No family history on file.  Social History:  Social History   Socioeconomic History  . Marital status: Single    Spouse name: Not on file  . Number of children: Not on file  . Years of education: Not on file  . Highest education level: Not on file  Occupational History  . Not on file  Social Needs  . Financial resource strain: Not on file  . Food insecurity    Worry: Not on file    Inability: Not on file  . Transportation needs    Medical: Not on file    Non-medical: Not on file  Tobacco Use  . Smoking status: Current Every Day Smoker    Packs/day: 1.00    Types: Cigarettes  . Smokeless tobacco: Never Used  Substance and Sexual Activity  . Alcohol use: Yes    Comment: occasionally  . Drug use: No  . Sexual activity: Yes    Birth control/protection: Surgical  Lifestyle  . Physical activity    Days per week: 1 day    Minutes per session: 30 min  . Stress: Rather much  Relationships  . Social Herbalist on phone: Not on file    Gets  together: Not on file    Attends religious service: Not on file    Active member of club or organization: Not on file    Attends meetings of clubs or organizations: Not on file    Relationship status: Not on file  . Intimate partner violence    Fear of current or ex partner: Not on file    Emotionally abused: Not on file    Physically abused: Not on file    Forced sexual activity: Not on file  Other Topics Concern  . Not on file  Social History Narrative  . Not on file    Allergies:  Allergies  Allergen Reactions  . Codeine Hives    Medications: Prior to Admission medications   Medication Sig Start Date End Date Taking? Authorizing Provider  ibuprofen (ADVIL,MOTRIN) 800 MG tablet Take 1 tablet (800 mg total) by mouth every 8 (eight) hours as needed for moderate pain. 02/10/18   Malachy Mood, MD  ketorolac (TORADOL) 10 MG tablet Take 1 tablet (10 mg total) by mouth every 6 (six) hours as needed. 08/23/18   Laban Emperor, PA-C  norethindrone (AYGESTIN) 5 MG tablet Take 1 tablet (5 mg total) by mouth daily. 04/17/19   Malachy Mood, MD  SUMAtriptan (IMITREX) 50 MG tablet Take 1 tablet (50 mg total) by mouth once as needed for migraine. May repeat in 2  hours if headache persists or recurs. Patient not taking: Reported on 01/07/2018 03/24/16 03/25/17  Emily FilbertWilliams, Jonathan E, MD    Physical Exam Vitals: Blood pressure 130/88, weight 228 lb (103.4 kg), last menstrual period 03/28/2019.  General: NAD HEENT: normocephalic, anicteric Pulmonary: No increased work of breathing Extremities: no edema, erythema, or tenderness Neurologic: Grossly intact, normal gait Psychiatric: mood appropriate, affect full  [place] Transvaginal Non-ob  Result Date: 04/17/2019 Patient Name: Gunnar Bullaynia Harcum DOB: 01/26/1981 MRN: [id number] ULTRASOUND REPORT Location: [facility] Date of Service: 04/17/2019 Indications:Pelvic Pain Findings: The uterus is anteverted and measures 9.2 x 5.4 x 4.9 cm. Echo  texture is homogenous without evidence of focal masses. The Endometrium measures 9.0 mm. Right Ovary measures 4.9 x 3.1 x 2.5 cm. It is normal in appearance. There are two corpus luteal cysts in the right ovary measuring 21 x 18 x 19 mm and 20 x 19 x 20 mm. Left Ovary measures 3.1 x 1.6 x 2.2 cm. It is normal in appearance. Survey of the adnexa demonstrates no adnexal masses. There is no free fluid in the cul de sac. Impression: 1. Normal pelvic ultrasound. 2. Two corpus luteal cysts in the right ovary. Recommendations: 1.Clinical correlation with the patient's History and Physical Exam. Deanna ArtisElyse S Fairbanks, RT Images reviewed.  Normal GYN study without visualized pathology.  Vena AustriaAndreas Aynslee Mulhall, MD, Evern CoreFACOG [facility], [facility] 04/17/2019, 1:03 PM      Assessment: 38 y.o. [id number] pelvic pain Plan: Problem List Items Addressed This Visit    None    Visit Diagnoses    Female pelvic pain    -  Primary      1) Pelvic pain - normal gyn ultrasound.  Discussed endometriosis as one gyn etiology which may have no imaging findings.   - Restart norethindrone  2) A total of 15 minutes were spent in face-to-face contact with the patient during this encounter with over half of that time devoted to counseling and coordination of care.  3) Return in about 6 weeks (around 05/29/2019) for medication follow up.    Vena AustriaAndreas Dayshaun Whobrey, MD, Evern CoreFACOG [facility], [facility] 04/21/2019, 11:13 PM

## 2019-04-24 ENCOUNTER — Other Ambulatory Visit: Payer: Self-pay

## 2019-05-29 ENCOUNTER — Ambulatory Visit: Payer: 59 | Admitting: Obstetrics and Gynecology

## 2019-05-30 ENCOUNTER — Emergency Department
Admission: EM | Admit: 2019-05-30 | Discharge: 2019-05-30 | Disposition: A | Discharge status: Home / Self Care | Payer: 59 | Attending: Student | Admitting: Student

## 2019-05-30 ENCOUNTER — Other Ambulatory Visit: Payer: Self-pay

## 2019-05-30 ENCOUNTER — Encounter: Payer: Self-pay | Admitting: Emergency Medicine

## 2019-05-30 DIAGNOSIS — R030 Elevated blood-pressure reading, without diagnosis of hypertension: Secondary | ICD-10-CM | POA: Diagnosis not present

## 2019-05-30 DIAGNOSIS — F17210 Nicotine dependence, cigarettes, uncomplicated: Secondary | ICD-10-CM | POA: Insufficient documentation

## 2019-05-30 DIAGNOSIS — R519 Headache, unspecified: Secondary | ICD-10-CM

## 2019-05-30 LAB — URINALYSIS, COMPLETE (UACMP) WITH MICROSCOPIC
Bacteria, UA: NONE SEEN
Bilirubin Urine: NEGATIVE
Glucose, UA: NEGATIVE mg/dL
Hgb urine dipstick: NEGATIVE
Ketones, ur: 5 mg/dL — AB
Leukocytes,Ua: NEGATIVE
Nitrite: NEGATIVE
Protein, ur: NEGATIVE mg/dL
Specific Gravity, Urine: 1.029 (ref 1.005–1.030)
pH: 6 (ref 5.0–8.0)

## 2019-05-30 LAB — BASIC METABOLIC PANEL
Anion gap: 7 (ref 5–15)
BUN: 9 mg/dL (ref 6–20)
CO2: 23 mmol/L (ref 22–32)
Calcium: 8.7 mg/dL — ABNORMAL LOW (ref 8.9–10.3)
Chloride: 108 mmol/L (ref 98–111)
Creatinine, Ser: 0.8 mg/dL (ref 0.44–1.00)
GFR calc Af Amer: >60 mL/min (ref >60)
GFR calc non Af Amer: >60 mL/min (ref >60)
Glucose, Bld: 124 mg/dL — ABNORMAL HIGH (ref 70–99)
Potassium: 3.7 mmol/L (ref 3.5–5.1)
Sodium: 138 mmol/L (ref 135–145)

## 2019-05-30 LAB — CBC WITH DIFFERENTIAL/PLATELET
Abs Immature Granulocytes: 0.02 10*3/uL (ref 0.00–0.07)
Basophils Absolute: 0 10*3/uL (ref 0.0–0.1)
Basophils Relative: 1 %
Eosinophils Absolute: 0.1 10*3/uL (ref 0.0–0.5)
Eosinophils Relative: 1 %
HCT: 38.4 % (ref 36.0–46.0)
Hemoglobin: 12.6 g/dL (ref 12.0–15.0)
Immature Granulocytes: 0 %
Lymphocytes Relative: 41 %
Lymphs Abs: 3.5 10*3/uL (ref 0.7–4.0)
MCH: 26.9 pg (ref 26.0–34.0)
MCHC: 32.8 g/dL (ref 30.0–36.0)
MCV: 82.1 fL (ref 80.0–100.0)
Monocytes Absolute: 0.6 10*3/uL (ref 0.1–1.0)
Monocytes Relative: 7 %
Neutro Abs: 4.4 10*3/uL (ref 1.7–7.7)
Neutrophils Relative %: 50 %
Platelets: 406 10*3/uL — ABNORMAL HIGH (ref 150–400)
RBC: 4.68 MIL/uL (ref 3.87–5.11)
RDW: 15 % (ref 11.5–15.5)
WBC: 8.6 10*3/uL (ref 4.0–10.5)
nRBC: 0 % (ref 0.0–0.2)

## 2019-05-30 LAB — POCT PREGNANCY, URINE: Preg Test, Ur: NEGATIVE

## 2019-05-30 MED ORDER — METOCLOPRAMIDE HCL 5 MG/ML IJ SOLN
10.0000 mg | Freq: Once | INTRAMUSCULAR | Status: AC
  Administered 2019-05-30: 10 mg via INTRAVENOUS
  Filled 2019-05-30: qty 2

## 2019-05-30 MED ORDER — SODIUM CHLORIDE 0.9 % IV BOLUS
1000.0000 mL | Freq: Once | INTRAVENOUS | Status: AC
  Administered 2019-05-30: 1000 mL via INTRAVENOUS

## 2019-05-30 MED ORDER — DIPHENHYDRAMINE HCL 50 MG/ML IJ SOLN
25.0000 mg | Freq: Once | INTRAMUSCULAR | Status: AC
  Administered 2019-05-30: 25 mg via INTRAVENOUS
  Filled 2019-05-30: qty 1

## 2019-05-30 MED ORDER — ACETAMINOPHEN 500 MG PO TABS
1000.0000 mg | ORAL_TABLET | Freq: Once | ORAL | Status: AC
  Administered 2019-05-30: 1000 mg via ORAL
  Filled 2019-05-30: qty 2

## 2019-05-30 NOTE — ED Notes (Signed)
Pt speaking with this RN in NAD, A&Ox4 

## 2019-05-30 NOTE — ED Triage Notes (Signed)
Pt arrives POV and ambulatory to triage with c/o HA x 3 days. Pt states that today her BP was over 200 and she has been taking Ibuprofin and Tylenol without relief. Pt states that her 37 year old son has just recently diagnosed with HTN and is concerned that she has the same and that has been contributing to her HA and dizziness. Pt is in NAD.

## 2019-05-30 NOTE — Discharge Instructions (Signed)
Thank you for letting us take care of you in the emergency department today.  ° °Please continue to take any regular, prescribed medications.  ° °Please follow up with: °- Your primary care doctor to review your ER visit and follow up on your symptoms.  ° °Please return to the ER for any new or worsening symptoms.  ° °

## 2019-05-31 NOTE — ED Provider Notes (Signed)
[facility] Emergency Department Provider Note  ____________________________________________   First MD Initiated Contact with Patient 05/30/19 2104     (approximate)  I have reviewed the triage vital signs and the nursing notes.  History  Chief Complaint Hypertension and Headache    HPI Kaitlyn Hall is a 38 y.o. female who presents for headache and concern for elevated blood pressure. She states she has had a headache for the past 3 days. It waxes and wanes in severity. It was not thunderclap or worst at onset. It is frontal in location, throbbing. Associated with some nausea and light sensitivity. She states she took her BP at home and noted it to be elevated, which concerned her. She is not on any BP medications. Headache is not associated with visual changes, syncope, weakness, numbness, or tingling. No fevers or neck stiffness. She has had moderate relief of symptoms at home with Tylenol and ibuprofen.    Past Medical Hx History reviewed. No pertinent past medical history.  Problem List There are no active problems to display for this patient.   Past Surgical Hx Past Surgical History:  Procedure Laterality Date  . CESAREAN SECTION  2010  . TUBAL LIGATION  2010    Medications Prior to Admission medications   Medication Sig Start Date End Date Taking? Authorizing Provider  ibuprofen (ADVIL,MOTRIN) 800 MG tablet Take 1 tablet (800 mg total) by mouth every 8 (eight) hours as needed for moderate pain. 02/10/18   Malachy Mood, MD  ketorolac (TORADOL) 10 MG tablet Take 1 tablet (10 mg total) by mouth every 6 (six) hours as needed. 08/23/18   Laban Emperor, PA-C  norethindrone (AYGESTIN) 5 MG tablet Take 1 tablet (5 mg total) by mouth daily. 04/17/19   Malachy Mood, MD  SUMAtriptan (IMITREX) 50 MG tablet Take 1 tablet (50 mg total) by mouth once as needed for migraine. May repeat in 2 hours if headache persists or recurs. Patient not taking:  Reported on 01/07/2018 03/24/16 03/25/17  Earleen Newport, MD    Allergies Codeine  Family Hx No family history on file.  Social Hx Social History   Tobacco Use  . Smoking status: Current Every Day Smoker    Packs/day: 1.00    Types: Cigarettes  . Smokeless tobacco: Never Used  Substance Use Topics  . Alcohol use: Yes    Comment: occasionally  . Drug use: No     Review of Systems  Constitutional: Negative for fever, chills. Eyes: Negative for visual changes. ENT: Negative for sore throat. Cardiovascular: Negative for chest pain. Respiratory: Negative for shortness of breath. Gastrointestinal: Negative for nausea, vomiting.  Genitourinary: Negative for dysuria. Musculoskeletal: Negative for leg swelling. Skin: Negative for rash. Neurological: + for for headaches.   Physical Exam  Vital Signs: ED Triage Vitals  Enc Vitals Group     BP 05/30/19 1857 (!) 161/102     Pulse Rate 05/30/19 1857 80     Resp 05/30/19 1857 16     Temp 05/30/19 1857 98.7 F (37.1 C)     Temp Source 05/30/19 1857 Oral     SpO2 05/30/19 1857 98 %     Weight 05/30/19 1904 228 lb (103.4 kg)     Height 05/30/19 1904 5\' 9"  (1.753 m)     Head Circumference --      Peak Flow --      Pain Score 05/30/19 1904 9     Pain Loc --      Pain  Edu? --      Excl. in GC? --     Constitutional: Alert and oriented.  Head: Normocephalic. Atraumatic. Eyes: Conjunctivae clear. Sclera anicteric. Nose: No congestion. No rhinorrhea. Mouth/Throat: Mucous membranes are moist.  Neck: No stridor.   Cardiovascular: Normal rate, regular rhythm. No murmurs. Extremities well perfused. Respiratory: Normal respiratory effort.  Lungs CTAB. Gastrointestinal: Soft. Non-tender. Non-distended.  Musculoskeletal: No lower extremity edema. No deformities. Neurologic:  Normal speech and language. No gross focal neurologic deficits are appreciated. Alert and oriented.  Face symmetric.  Tongue midline.  Cranial nerves II  through XII intact. UE and LE strength 5/5 and symmetric. UE and LE SILT.  Skin: Skin is warm, dry and intact. No rash noted. Psychiatric: Mood and affect are appropriate for situation.  EKG  Personally reviewed.   Rate: within normal limits Rhythm: sinus Axis: normal Intervals: within normal limits No acute ischemic changes No STEMI    Radiology  N/A   Procedures  Procedure(s) performed (including critical care):  Procedures   Initial Impression / Assessment and Plan / ED Course  38 y.o. female who presents to the ED for headache, elevated BP as above.   Ddx: suspect likely migraine vs tension headache. Not thunderclap/worse at onset/worst of life to suggest SAH. No fever, neck stiffness, infection symptoms to suggest meningitis. Also suspect her BP elevation was in response to her headache.  Plan: labs initiated in triage w/o actionable derangements. Migraine cocktail, reassess.  After migraine cocktail patient reports complete improvement in symptoms. BP only slightly elevated after pain control at 139/89. Advised she keep a BP log and follow up with PCP. She voices understanding of this. Will discharge, given return precautions.   Final Clinical Impression(s) / ED Diagnosis  Final diagnoses:  Complaint of headache  Elevated blood pressure reading       Note:  This document was prepared using Dragon voice recognition software and may include unintentional dictation errors.   Miguel Aschoff., MD 05/31/19 [id number]

## 2019-06-05 ENCOUNTER — Other Ambulatory Visit: Payer: Self-pay

## 2019-06-05 ENCOUNTER — Encounter: Payer: Self-pay | Admitting: Obstetrics and Gynecology

## 2019-06-05 ENCOUNTER — Ambulatory Visit (INDEPENDENT_AMBULATORY_CARE_PROVIDER_SITE_OTHER): Payer: 59 | Admitting: Obstetrics and Gynecology

## 2019-06-05 VITALS — BP 142/88 | HR 89 | Ht 69.0 in | Wt 229.0 lb

## 2019-06-05 DIAGNOSIS — R102 Pelvic and perineal pain: Secondary | ICD-10-CM

## 2019-06-05 DIAGNOSIS — N939 Abnormal uterine and vaginal bleeding, unspecified: Secondary | ICD-10-CM

## 2019-06-05 NOTE — Progress Notes (Signed)
Obstetrics & Gynecology Office Visit   Chief Complaint:  Chief Complaint  Patient presents with  . Follow-up    MIgraines since starting medication/high blood pressure/irregular cycles    History of Present Illness: 38 y.o. [id number] presenting for medication follow up for a diagnosis of suspected endometriosis.  She is currently being managed with norethindrone.   The patient reports no improvement in symptoms.  On her current medication regimen has had poor cycle control with irregular or heavy periods.   She has noted headaches since starting the norethindrone.  First month had very light bleeding for a few days.  Now has more significant breakthrough bleeding.  Also feels worsening headaches since starting norethindrone.  Review of Systems: Review of Systems  Constitutional: Negative.   Gastrointestinal: Positive for abdominal pain.  Genitourinary: Negative.   Skin: Negative.   Neurological: Positive for headaches.     Past Medical History:  Past Medical History:  Diagnosis Date  . Migraine     Past Surgical History:  Past Surgical History:  Procedure Laterality Date  . CESAREAN SECTION  2010  . TUBAL LIGATION  2010    Gynecologic History: Patient's last menstrual period was 06/02/2019 (exact date).  Obstetric History: [id number]  Family History:  History reviewed. No pertinent family history.  Social History:  Social History   Socioeconomic History  . Marital status: Single    Spouse name: Not on file  . Number of children: Not on file  . Years of education: Not on file  . Highest education level: Not on file  Occupational History  . Not on file  Social Needs  . Financial resource strain: Not on file  . Food insecurity    Worry: Not on file    Inability: Not on file  . Transportation needs    Medical: Not on file    Non-medical: Not on file  Tobacco Use  . Smoking status: Current Every Day Smoker    Packs/day: 1.00    Types: Cigarettes  .  Smokeless tobacco: Never Used  Substance and Sexual Activity  . Alcohol use: Yes    Comment: occasionally  . Drug use: No  . Sexual activity: Yes    Birth control/protection: Surgical  Lifestyle  . Physical activity    Days per week: 1 day    Minutes per session: 30 min  . Stress: Rather much  Relationships  . Social Herbalist on phone: Not on file    Gets together: Not on file    Attends religious service: Not on file    Active member of club or organization: Not on file    Attends meetings of clubs or organizations: Not on file    Relationship status: Not on file  . Intimate partner violence    Fear of current or ex partner: Not on file    Emotionally abused: Not on file    Physically abused: Not on file    Forced sexual activity: Not on file  Other Topics Concern  . Not on file  Social History Narrative  . Not on file    Allergies:  Allergies  Allergen Reactions  . Codeine Hives    Medications: Prior to Admission medications   Medication Sig Start Date End Date Taking? Authorizing Provider  ibuprofen (ADVIL,MOTRIN) 800 MG tablet Take 1 tablet (800 mg total) by mouth every 8 (eight) hours as needed for moderate pain. 02/10/18  Yes Malachy Mood, MD  ketorolac (TORADOL) 10  MG tablet Take 1 tablet (10 mg total) by mouth every 6 (six) hours as needed. 08/23/18  Yes Enid Derry, PA-C  norethindrone (AYGESTIN) 5 MG tablet Take 1 tablet (5 mg total) by mouth daily. 04/17/19  Yes Vena Austria, MD  SUMAtriptan (IMITREX) 50 MG tablet Take 1 tablet (50 mg total) by mouth once as needed for migraine. May repeat in 2 hours if headache persists or recurs. Patient not taking: Reported on 01/07/2018 03/24/16 03/25/17  Emily Filbert, MD    Physical Exam Vitals:  Vitals:   06/05/19 0806  BP: (!) 142/88  Pulse: 89   Patient's last menstrual period was 06/02/2019 (exact date).  General: NAD HEENT: normocephalic, anicteric Thyroid: no enlargement, no  palpable nodules Pulmonary: No increased work of breathing Cardiovascular: RRR, distal pulses 2+ Abdomen: NABS, soft, non-tender, non-distended.  Umbilicus without lesions.  No hepatomegaly, splenomegaly or masses palpable. No evidence of hernia  Genitourinary:  External: Normal external female genitalia.  Normal urethral meatus, normal  Bartholin's and Skene's glands.    Vagina: Normal vaginal mucosa, no evidence of prolapse.    Cervix: Grossly normal in appearance, no bleeding  Uterus: Non-enlarged, mobile, normal contour.  No CMT  Adnexa: ovaries non-enlarged, no adnexal masses  Rectal: deferred  Lymphatic: no evidence of inguinal lymphadenopathy Extremities: no edema, erythema, or tenderness Neurologic: Grossly intact Psychiatric: mood appropriate, affect full  Female chaperone present for pelvic  portions of the physical exam  Assessment: 38 y.o. [id number] follow up of norethindrone for dysmenorrhea for presumptive endometriosis  Plan: Problem List Items Addressed This Visit    None    Visit Diagnoses    Abnormal uterine bleeding    -  Primary   Pelvic pain         1) Presumptive endometriosis - continue with norethindrone.  Discussed initial bleeding pattern as lining stabilizes and thins out.  Classically norethindrone is not contra-indicated in patient with HTN or migraine, and should not have a negative impact on either.  Estrogen containing OCP which are also used in the treatment of endometriosis may worsen both of these though.  Other options for management include trial of Mirena IUD, or addition of Orilissa.  We also discussed diagnostic laparoscopy for definitive diagnosis if needed.   - Will switch to taking norethindrone before bedtime - If no improvement in 2 week we discussed laparoscopy or orilissa.  IUD remains an option as well -If improvement but overall inadequate respose in pain discussed adding orilissa  in 6 weeks  2) A total of 15 minutes were spent in  face-to-face contact with the patient during this encounter with over half of that time devoted to counseling and coordination of care.  3) Return in about 6 weeks (around 07/17/2019).    Vena Austria, MD, [facility], [facility] 06/05/2019, 11:56 AM

## 2019-06-11 ENCOUNTER — Encounter: Payer: Self-pay | Admitting: *Deleted

## 2019-06-11 ENCOUNTER — Ambulatory Visit (INDEPENDENT_AMBULATORY_CARE_PROVIDER_SITE_OTHER)
Admission: EM | Admit: 2019-06-11 | Discharge: 2019-06-11 | Disposition: A | Discharge status: Home / Self Care | Payer: 59 | Source: Home / Self Care | Attending: Family Medicine | Admitting: Family Medicine

## 2019-06-11 ENCOUNTER — Other Ambulatory Visit: Payer: Self-pay

## 2019-06-11 ENCOUNTER — Emergency Department
Admission: EM | Admit: 2019-06-11 | Discharge: 2019-06-12 | Disposition: A | Discharge status: Home / Self Care | Payer: 59 | Attending: Emergency Medicine | Admitting: Emergency Medicine

## 2019-06-11 ENCOUNTER — Emergency Department: Payer: 59

## 2019-06-11 ENCOUNTER — Encounter: Payer: Self-pay | Admitting: Emergency Medicine

## 2019-06-11 DIAGNOSIS — F17210 Nicotine dependence, cigarettes, uncomplicated: Secondary | ICD-10-CM | POA: Insufficient documentation

## 2019-06-11 DIAGNOSIS — Z20828 Contact with and (suspected) exposure to other viral communicable diseases: Secondary | ICD-10-CM | POA: Insufficient documentation

## 2019-06-11 DIAGNOSIS — J988 Other specified respiratory disorders: Secondary | ICD-10-CM

## 2019-06-11 DIAGNOSIS — J40 Bronchitis, not specified as acute or chronic: Secondary | ICD-10-CM | POA: Diagnosis not present

## 2019-06-11 DIAGNOSIS — R0602 Shortness of breath: Secondary | ICD-10-CM | POA: Diagnosis present

## 2019-06-11 LAB — BASIC METABOLIC PANEL
Anion gap: 5 (ref 5–15)
BUN: 10 mg/dL (ref 6–20)
CO2: 23 mmol/L (ref 22–32)
Calcium: 9.1 mg/dL (ref 8.9–10.3)
Chloride: 107 mmol/L (ref 98–111)
Creatinine, Ser: 0.76 mg/dL (ref 0.44–1.00)
GFR calc Af Amer: >60 mL/min (ref >60)
GFR calc non Af Amer: >60 mL/min (ref >60)
Glucose, Bld: 122 mg/dL — ABNORMAL HIGH (ref 70–99)
Potassium: 3.9 mmol/L (ref 3.5–5.1)
Sodium: 135 mmol/L (ref 135–145)

## 2019-06-11 LAB — CBC
HCT: 38.6 % (ref 36.0–46.0)
Hemoglobin: 12.9 g/dL (ref 12.0–15.0)
MCH: 27.3 pg (ref 26.0–34.0)
MCHC: 33.4 g/dL (ref 30.0–36.0)
MCV: 81.6 fL (ref 80.0–100.0)
Platelets: 354 10*3/uL (ref 150–400)
RBC: 4.73 MIL/uL (ref 3.87–5.11)
RDW: 14.7 % (ref 11.5–15.5)
WBC: 11.8 10*3/uL — ABNORMAL HIGH (ref 4.0–10.5)
nRBC: 0 % (ref 0.0–0.2)

## 2019-06-11 LAB — TROPONIN I (HIGH SENSITIVITY): Troponin I (High Sensitivity): <2 ng/L (ref <18)

## 2019-06-11 MED ORDER — BENZONATATE 200 MG PO CAPS: 200.0000 mg | ORAL_CAPSULE | Freq: Three times a day (TID) | ORAL | 0 refills | Status: DC | PRN

## 2019-06-11 MED ORDER — PREDNISONE 50 MG PO TABS: ORAL_TABLET | ORAL | 0 refills | Status: DC

## 2019-06-11 NOTE — ED Triage Notes (Signed)
Patient c/o coughing and chest congestion that started 2 days ago. Denies fever. She states she is coughing to the point of vomiting.

## 2019-06-11 NOTE — ED Triage Notes (Signed)
Pt to ED reporting SOB with a cough x 2 days. Pt reporting the cough has been causing her to vomit and causing CP. No fevers that she is aware of and no known exposure to COVID.

## 2019-06-11 NOTE — ED Provider Notes (Signed)
[facility]    CSN: [id number] Arrival date & time: 06/11/19  0841  History   Chief Complaint Chief Complaint  Patient presents with  . Cough    APPT  . Emesis   HPI  38 year old female presents with the above complaints.  Patient reports that she has had chest tightness for the past 3 days.  She reports cough started last night.  Severe.  She reports posttussive emesis.  She reports congestion as well.  Also notes that she feels weak.  Denies fever.  No reported sick contacts.  She has been using Tylenol and over-the-counter cough medication without resolution.  Cough seems to be worse at night.  No relieving factors.  No other reported symptoms.  No other complaints or concerns at this time.  History reviewed and updated as below.  Past Medical History:  Diagnosis Date  . Migraine    Past Surgical History:  Procedure Laterality Date  . CESAREAN SECTION  2010  . TUBAL LIGATION  2010    OB History    Gravida  7   Para  5   Term      Preterm      AB  2   Living  5     SAB      TAB      Ectopic      Multiple      Live Births             Home Medications    Prior to Admission medications   Medication Sig Start Date End Date Taking? Authorizing Provider  ibuprofen (ADVIL,MOTRIN) 800 MG tablet Take 1 tablet (800 mg total) by mouth every 8 (eight) hours as needed for moderate pain. 02/10/18  Yes Vena Austria, MD  norethindrone (AYGESTIN) 5 MG tablet Take 1 tablet (5 mg total) by mouth daily. 04/17/19  Yes Vena Austria, MD  benzonatate (TESSALON) 200 MG capsule Take 1 capsule (200 mg total) by mouth 3 (three) times daily as needed for cough. 06/11/19   Tommie Sams, DO  predniSONE (DELTASONE) 50 MG tablet 1 tablet daily x 5 days 06/11/19   Tommie Sams, DO  SUMAtriptan (IMITREX) 50 MG tablet Take 1 tablet (50 mg total) by mouth once as needed for migraine. May repeat in 2 hours if headache persists or recurs. Patient not taking:  Reported on 01/07/2018 03/24/16 06/11/19  Emily Filbert, MD   Social History Social History   Tobacco Use  . Smoking status: Current Every Day Smoker    Packs/day: 1.00    Types: Cigarettes  . Smokeless tobacco: Never Used  Substance Use Topics  . Alcohol use: Yes    Comment: occasionally  . Drug use: No   Allergies   Codeine  Review of Systems Review of Systems  Constitutional: Negative for fever.  HENT: Positive for congestion.   Respiratory: Positive for cough and chest tightness.   Gastrointestinal: Positive for vomiting.   Physical Exam Triage Vital Signs ED Triage Vitals  Enc Vitals Group     BP 06/11/19 0916 (!) 132/98     Pulse Rate 06/11/19 0916 89     Resp 06/11/19 0916 18     Temp 06/11/19 0916 98.4 F (36.9 C)     Temp Source 06/11/19 0916 Oral     SpO2 06/11/19 0916 100 %     Weight 06/11/19 0911 230 lb (104.3 kg)     Height 06/11/19 0911 5\' 9"  (1.753 m)  Head Circumference --      Peak Flow --      Pain Score 06/11/19 0911 9     Pain Loc --      Pain Edu? --      Excl. in [place]? --    Updated Vital Signs BP (!) 132/98 (BP Location: Right Arm)   Pulse 89   Temp 98.4 F (36.9 C) (Oral)   Resp 18   Ht 5\' 9"  (1.753 m)   Wt 104.3 kg   LMP 06/02/2019 (Exact Date)   SpO2 100%   BMI 33.97 kg/m   Visual Acuity Right Eye Distance:   Left Eye Distance:   Bilateral Distance:    Right Eye Near:   Left Eye Near:    Bilateral Near:     Physical Exam Vitals signs and nursing note reviewed.  Constitutional:      General: She is not in acute distress.    Appearance: Normal appearance. She is not ill-appearing.  HENT:     Head: Normocephalic and atraumatic.     Right Ear: Tympanic membrane normal.     Left Ear: Tympanic membrane normal.     Mouth/Throat:     Mouth: Mucous membranes are moist.     Pharynx: Posterior oropharyngeal erythema present. No oropharyngeal exudate.  Eyes:     General:        Right eye: No discharge.        Left  eye: No discharge.     Conjunctiva/sclera: Conjunctivae normal.  Cardiovascular:     Rate and Rhythm: Normal rate and regular rhythm.     Heart sounds: No murmur.  Pulmonary:     Effort: Pulmonary effort is normal.     Breath sounds: Normal breath sounds. No wheezing, rhonchi or rales.  Neurological:     Mental Status: She is alert.  Psychiatric:        Mood and Affect: Mood normal.        Behavior: Behavior normal.    UC Treatments / Results  Labs (all labs ordered are listed, but only abnormal results are displayed) Labs Reviewed  NOVEL CORONAVIRUS, NAA (HOSP ORDER, SEND-OUT TO REF LAB; TAT 18-24 HRS)    EKG   Radiology No results found.  Procedures Procedures (including critical care time)  Medications Ordered in UC Medications - No data to display  Initial Impression / Assessment and Plan / UC Course  I have reviewed the triage vital signs and the nursing notes.  Pertinent labs & imaging results that were available during my care of the patient were reviewed by me and considered in my medical decision making (see chart for details).    38 year old female presents with a respiratory infection.  Likely viral.  I do not think this is COVID-19 related, however I cannot exclude this until her test result returns.  Tessalon Perles and prednisone as directed.  Supportive care.  Final Clinical Impressions(s) / UC Diagnoses   Final diagnoses:  Respiratory infection     Discharge Instructions     Rest, fluids.  Medications as directed.  Awaiting COVID test results.  Take care  Dr. Lacinda Axon    ED Prescriptions    Medication Sig Dispense Auth. Provider   predniSONE (DELTASONE) 50 MG tablet 1 tablet daily x 5 days 5 tablet Rowland Ericsson G, DO   benzonatate (TESSALON) 200 MG capsule Take 1 capsule (200 mg total) by mouth 3 (three) times daily as needed for cough. 20 capsule Coral Spikes,  DO     PDMP not reviewed this encounter.   Everlene OtherCook, Nour Rodrigues [place], [place] 06/11/19 [phone]

## 2019-06-11 NOTE — Discharge Instructions (Signed)
Rest, fluids.  Medications as directed.  Awaiting COVID test results.  Take care  Dr. Lacinda Axon

## 2019-06-12 LAB — NOVEL CORONAVIRUS, NAA (HOSP ORDER, SEND-OUT TO REF LAB; TAT 18-24 HRS): SARS-CoV-2, NAA: NOT DETECTED

## 2019-06-12 LAB — SARS CORONAVIRUS 2 (TAT 6-24 HRS): SARS Coronavirus 2: NEGATIVE

## 2019-06-12 MED ORDER — DEXTROMETHORPHAN HBR 15 MG/5ML PO SYRP: 10.0000 mL | ORAL_SOLUTION | Freq: Four times a day (QID) | ORAL | 0 refills | Status: DC | PRN

## 2019-06-12 MED ORDER — IPRATROPIUM-ALBUTEROL 0.5-2.5 (3) MG/3ML IN SOLN
3.0000 mL | Freq: Once | RESPIRATORY_TRACT | Status: AC
  Administered 2019-06-12: 3 mL via RESPIRATORY_TRACT
  Filled 2019-06-12: qty 3

## 2019-06-12 MED ORDER — ALBUTEROL SULFATE HFA 108 (90 BASE) MCG/ACT IN AERS: 2.0000 | INHALATION_SPRAY | Freq: Four times a day (QID) | RESPIRATORY_TRACT | 0 refills | Status: DC | PRN

## 2019-06-12 MED ORDER — AZITHROMYCIN 500 MG PO TABS
500.0000 mg | ORAL_TABLET | Freq: Once | ORAL | Status: AC
  Administered 2019-06-12: 500 mg via ORAL
  Filled 2019-06-12: qty 1

## 2019-06-12 MED ORDER — AZITHROMYCIN 500 MG PO TABS: 500.0000 mg | ORAL_TABLET | Freq: Every day | ORAL | 0 refills | Status: AC

## 2019-06-12 NOTE — ED Provider Notes (Signed)
[facility] Emergency Department Provider Note  ____________________________________________   First MD Initiated Contact with Patient 06/11/19 2351     (approximate)  I have reviewed the triage vital signs and the nursing notes.   HISTORY  Chief Complaint Shortness of Breath and Cough    HPI Kaitlyn Hall is a 38 y.o. female with history of migraine headaches 1 pack/day cigarette use presents to the emergency department secondary to 2-day history of dyspnea and productive cough.  Patient admits to chest discomfort with coughing.  Patient denies any fever.  Patient denies any known exposure to COVID-19.  Patient denies any lower extremity pain or swelling.  Patient denies any personal history of DVT/PE or CAD.  Patient was seen at urgent care and prescribed prednisone and Tessalon however states that symptoms have persisted and worsened        Past Medical History:  Diagnosis Date  . Migraine     There are no active problems to display for this patient.   Past Surgical History:  Procedure Laterality Date  . CESAREAN SECTION  2010  . TUBAL LIGATION  2010    Prior to Admission medications   Medication Sig Start Date End Date Taking? Authorizing Provider  benzonatate (TESSALON) 200 MG capsule Take 1 capsule (200 mg total) by mouth 3 (three) times daily as needed for cough. 06/11/19   Coral Spikes, DO  ibuprofen (ADVIL,MOTRIN) 800 MG tablet Take 1 tablet (800 mg total) by mouth every 8 (eight) hours as needed for moderate pain. 02/10/18   Malachy Mood, MD  norethindrone (AYGESTIN) 5 MG tablet Take 1 tablet (5 mg total) by mouth daily. 04/17/19   Malachy Mood, MD  predniSONE (DELTASONE) 50 MG tablet 1 tablet daily x 5 days 06/11/19   Coral Spikes, DO  SUMAtriptan (IMITREX) 50 MG tablet Take 1 tablet (50 mg total) by mouth once as needed for migraine. May repeat in 2 hours if headache persists or recurs. Patient not taking: Reported on  01/07/2018 03/24/16 06/11/19  Earleen Newport, MD    Allergies Codeine  History reviewed. No pertinent family history.  Social History Social History   Tobacco Use  . Smoking status: Current Every Day Smoker    Packs/day: 1.00    Types: Cigarettes  . Smokeless tobacco: Never Used  Substance Use Topics  . Alcohol use: Yes    Comment: occasionally  . Drug use: No    Review of Systems Constitutional: No fever/chills Eyes: No visual changes. ENT: No sore throat.  Positive for congestion Cardiovascular: Denies chest pain. Respiratory: Positive for dyspnea and cough Gastrointestinal: No abdominal pain.  No nausea, no vomiting.  No diarrhea.  No constipation. Genitourinary: Negative for dysuria. Musculoskeletal: Negative for neck pain.  Negative for back pain. Integumentary: Negative for rash. Neurological: Negative for headaches, focal weakness or numbness.   ____________________________________________   PHYSICAL EXAM:  VITAL SIGNS: ED Triage Vitals  Enc Vitals Group     BP 06/11/19 2057 (!) 154/82     Pulse Rate 06/11/19 2057 86     Resp 06/11/19 2057 16     Temp 06/11/19 2057 98.7 F (37.1 C)     Temp Source 06/11/19 2057 Oral     SpO2 06/11/19 2057 99 %     Weight --      Height 06/11/19 2058 1.753 m (5\' 9" )     Head Circumference --      Peak Flow --      Pain  Score --      Pain Loc --      Pain Edu? --      Excl. in GC? --     Constitutional: Alert and oriented.  Eyes: Conjunctivae are normal.  Mouth/Throat: Mucous membranes are moist. Neck: No stridor.  No meningeal signs.   Cardiovascular: Normal rate, regular rhythm. Good peripheral circulation. Grossly normal heart sounds. Respiratory: Normal respiratory effort.  No retractions.  Mild expiratory wheezing noted  gastrointestinal: Soft and nontender. No distention.  Musculoskeletal: No lower extremity tenderness nor edema. No gross deformities of extremities. Neurologic:  Normal speech and  language. No gross focal neurologic deficits are appreciated.  Skin:  Skin is warm, dry and intact. Psychiatric: Mood and affect are normal. Speech and behavior are normal.  ____________________________________________   LABS (all labs ordered are listed, but only abnormal results are displayed)  Labs Reviewed  BASIC METABOLIC PANEL - Abnormal; Notable for the following components:      Result Value   Glucose, Bld 122 (*)    All other components within normal limits  CBC - Abnormal; Notable for the following components:   WBC 11.8 (*)    All other components within normal limits  TROPONIN I (HIGH SENSITIVITY)   ____________________________________________  EKG  ED ECG REPORT I, [place] N Abdulahad Mederos, the attending physician, personally viewed and interpreted this ECG.   Date: 06/11/2019  EKG Time: 8:54 PM  Rate: 86  Rhythm: Normal sinus rhythm  Axis: Normal  Intervals: Normal  ST&T Change: None  ____________________________________________  RADIOLOGY I, [place] N Addie Alonge, personally viewed and evaluated these images (plain radiographs) as part of my medical decision making, as well as reviewing the written report by the radiologist.  ED MD interpretation: No acute cardiopulmonary process noted on chest x-ray per radiologist  Official radiology report(s): Dg Chest 2 View  Result Date: 06/11/2019 CLINICAL DATA:  38 year old female with chest pain and shortness of breath. EXAM: CHEST - 2 VIEW COMPARISON:  Chest radiograph dated 04/23/2017 FINDINGS: There is no focal consolidation, pleural effusion, pneumothorax. Apparent 18 mm nodular density in the right infrahilar region, likely artifactual and related to vascular confluence. Attention on follow-up imaging recommended. If there is clinical concern for lung nodule or patient has history of smoking, this can be better evaluated with CT on a nonemergent basis. The cardiac silhouette is within normal limits. No acute osseous  pathology. IMPRESSION: 1. No acute cardiopulmonary process. 2. Apparent 18 mm nodular density in the right infrahilar region, likely artifactual. Attention on follow-up imaging recommended. Electronically Signed   By: Elgie Collard M.D.   On: 06/11/2019 21:29     Procedures   ____________________________________________   INITIAL IMPRESSION / MDM / ASSESSMENT AND PLAN / ED COURSE  As part of my medical decision making, I reviewed the following data within the electronic MEDICAL RECORD NUMBER   38 year old female with 1 pack/day cigarette use presents to the emergency department secondary to above-stated history and physical exam.  Concern for possible bronchitis pneumonia and less likely COVID-19.  Patient given 2 duo nebs and azithromycin in the emergency department on reevaluation patient states that she feels better.  Patient will be prescribed Azithromycin, albuterol inhaler and dextromethorphan for home  ____________________________________________  FINAL CLINICAL IMPRESSION(S) / ED DIAGNOSES  Final diagnoses:  Bronchitis     MEDICATIONS GIVEN DURING THIS VISIT:  Medications  ipratropium-albuterol (DUONEB) 0.5-2.5 (3) MG/3ML nebulizer solution 3 mL (has no administration in time range)  ipratropium-albuterol (DUONEB) 0.5-2.5 (3) MG/3ML  nebulizer solution 3 mL (has no administration in time range)  azithromycin (ZITHROMAX) tablet 500 mg (has no administration in time range)     ED Discharge Orders    None      *Please note:  Kaitlyn Hall was evaluated in Emergency Department on 06/12/2019 for the symptoms described in the history of present illness. She was evaluated in the context of the global COVID-19 pandemic, which necessitated consideration that the patient might be at risk for infection with the SARS-CoV-2 virus that causes COVID-19. Institutional protocols and algorithms that pertain to the evaluation of patients at risk for COVID-19 are in a state of rapid change  based on information released by regulatory bodies including the CDC and federal and state organizations. These policies and algorithms were followed during the patient's care in the ED.  Some ED evaluations and interventions may be delayed as a result of limited staffing during the pandemic.*  Note:  This document was prepared using Dragon voice recognition software and may include unintentional dictation errors.   Darci CurrentBrown, [place] N, MD 06/12/19 2225

## 2019-07-22 ENCOUNTER — Other Ambulatory Visit: Payer: Self-pay

## 2019-07-22 ENCOUNTER — Ambulatory Visit (INDEPENDENT_AMBULATORY_CARE_PROVIDER_SITE_OTHER): Payer: 59 | Admitting: Obstetrics and Gynecology

## 2019-07-22 ENCOUNTER — Encounter: Payer: Self-pay | Admitting: Obstetrics and Gynecology

## 2019-07-22 VITALS — BP 122/82 | HR 101 | Ht 69.0 in | Wt 233.0 lb

## 2019-07-22 DIAGNOSIS — N809 Endometriosis, unspecified: Secondary | ICD-10-CM

## 2019-07-22 DIAGNOSIS — R102 Pelvic and perineal pain: Secondary | ICD-10-CM

## 2019-07-22 MED ORDER — ORILISSA 150 MG PO TABS: 1.0000 | ORAL_TABLET | Freq: Every day | ORAL | 5 refills | Status: DC

## 2019-07-22 NOTE — Progress Notes (Signed)
Obstetrics & Gynecology Office Visit   Chief Complaint:  Chief Complaint  Patient presents with  . Follow-up    Norethindrone    History of Present Illness: 38 y.o. [id number] presenting for medication follow up for a diagnosis of pelvic pain suspected endometriosis.  She is currently being managed with norethindrone.   The patient reports improvement in symptoms but continued continue intermitten flair up of pain.  On her current medication regimen the patient has achieved amenorrhea.   She has not noted any side-effects or new symptoms.    Review of Systems: Review of Systems  Constitutional: Negative.   Gastrointestinal: Positive for abdominal pain. Negative for constipation, diarrhea, nausea and vomiting.  Genitourinary: Negative.      Past Medical History:  Past Medical History:  Diagnosis Date  . Migraine     Past Surgical History:  Past Surgical History:  Procedure Laterality Date  . CESAREAN SECTION  2010  . TUBAL LIGATION  2010    Gynecologic History: No LMP recorded. (Menstrual status: Oral contraceptives).  Obstetric History: [id number]  Family History:  History reviewed. No pertinent family history.  Social History:  Social History   Socioeconomic History  . Marital status: Single    Spouse name: Not on file  . Number of children: Not on file  . Years of education: Not on file  . Highest education level: Not on file  Occupational History  . Not on file  Social Needs  . Financial resource strain: Not on file  . Food insecurity    Worry: Not on file    Inability: Not on file  . Transportation needs    Medical: Not on file    Non-medical: Not on file  Tobacco Use  . Smoking status: Current Every Day Smoker    Packs/day: 1.00    Types: Cigarettes  . Smokeless tobacco: Never Used  Substance and Sexual Activity  . Alcohol use: Yes    Comment: occasionally  . Drug use: No  . Sexual activity: Yes    Birth control/protection: Surgical   Lifestyle  . Physical activity    Days per week: 1 day    Minutes per session: 30 min  . Stress: Rather much  Relationships  . Social Herbalist on phone: Not on file    Gets together: Not on file    Attends religious service: Not on file    Active member of club or organization: Not on file    Attends meetings of clubs or organizations: Not on file    Relationship status: Not on file  . Intimate partner violence    Fear of current or ex partner: Not on file    Emotionally abused: Not on file    Physically abused: Not on file    Forced sexual activity: Not on file  Other Topics Concern  . Not on file  Social History Narrative  . Not on file    Allergies:  Allergies  Allergen Reactions  . Codeine Hives    Medications: Prior to Admission medications   Medication Sig Start Date End Date Taking? Authorizing Provider  albuterol (VENTOLIN HFA) 108 (90 Base) MCG/ACT inhaler Inhale 2 puffs into the lungs every 6 (six) hours as needed for wheezing or shortness of breath. 06/12/19  Yes Gregor Hams, MD  ibuprofen (ADVIL,MOTRIN) 800 MG tablet Take 1 tablet (800 mg total) by mouth every 8 (eight) hours as needed for moderate pain. 02/10/18  Yes [place],  Annita Ratliff, MD  norethindrone (AYGESTIN) 5 MG tablet Take 1 tablet (5 mg total) by mouth daily. 04/17/19  Yes Vena Austria, MD  Elagolix Sodium (ORILISSA) 150 MG TABS Take 1 tablet by mouth daily. 07/22/19   Vena Austria, MD  SUMAtriptan (IMITREX) 50 MG tablet Take 1 tablet (50 mg total) by mouth once as needed for migraine. May repeat in 2 hours if headache persists or recurs. Patient not taking: Reported on 01/07/2018 03/24/16 06/11/19  Emily Filbert, MD    Physical Exam Vitals:  Vitals:   07/22/19 0814  BP: 122/82  Pulse: (!) 101   No LMP recorded. (Menstrual status: Oral contraceptives).  General: NAD, well nourished, appears stated age HEENT: normocephalic, anicteric Pulmonary: No increased work  of breathing Extremities: no edema, erythema, or tenderness Neurologic: Grossly intact Psychiatric: mood appropriate, affect full  Assessment: 38 y.o. [id number] follow up pelvic pain  Plan: Problem List Items Addressed This Visit    None    Visit Diagnoses    Pelvic pain    -  Primary   Endometriosis         1) Suspected endometriosis  - partial response to norethindrone 5mg  po daily - good cycle control but continued pain episodes - add Orilissa 150mg  po daily - Discussed time to see effect as well as side-effect profile, savings program discussed - If good response to this regimen may also consider switching progestin administration from po to IUD  2) A total of 15 minutes were spent in face-to-face contact with the patient during this encounter with over half of that time devoted to counseling and coordination of care.  3) Return in about 6 weeks (around 09/02/2019) for medication follow up.   , MD, 10/31/2019 OB/GYN, [facility] 07/22/2019, 8:38 AM

## 2019-08-03 ENCOUNTER — Telehealth: Payer: Self-pay

## 2019-08-03 NOTE — Telephone Encounter (Signed)
PA was denied d/t pt has not tried Lupron or Synare. Please advise

## 2019-08-03 NOTE — Telephone Encounter (Signed)
Pt calling; checking on PA for bc; pharm said ins denied it; change bc or figure out why it was denied?  Let her know.  [phone]

## 2019-08-05 ENCOUNTER — Other Ambulatory Visit: Payer: Self-pay | Admitting: Obstetrics and Gynecology

## 2019-08-05 MED ORDER — LUPRON DEPOT (3-MONTH) 11.25 MG IM KIT: 11.2500 mg | PACK | INTRAMUSCULAR | 1 refills | Status: DC

## 2019-08-05 NOTE — Telephone Encounter (Signed)
Rx sent for lupron to try first

## 2019-08-05 NOTE — Telephone Encounter (Signed)
Insurance will or will not

## 2019-08-06 NOTE — Telephone Encounter (Signed)
I spoke to pharmacist. He states he spoke to the patient yesterday letting her know the Lupron will be going through [facility]. She should be getting a call from them to discuss billing and shipment.  Forwarding to [employer], team lead, to follow up in my absence.

## 2019-08-06 NOTE — Telephone Encounter (Signed)
Ok I also put in a lurpon order if the Freida Busman does not go through

## 2019-09-03 ENCOUNTER — Ambulatory Visit: Payer: 59 | Admitting: Obstetrics and Gynecology

## 2019-09-03 ENCOUNTER — Encounter: Payer: Self-pay | Admitting: Obstetrics and Gynecology

## 2019-09-03 ENCOUNTER — Other Ambulatory Visit: Payer: Self-pay

## 2019-09-03 VITALS — BP 130/80 | Ht 69.0 in | Wt 239.0 lb

## 2019-10-22 NOTE — Telephone Encounter (Signed)
[facility], they stated the medication was approved however, it has to go through [facility]. I called [facility] and faxed over a copy of the Rx for Lupron [place]. I will follow up regarding status.

## 2019-10-26 ENCOUNTER — Other Ambulatory Visit: Payer: Self-pay | Admitting: Obstetrics and Gynecology

## 2019-10-26 MED ORDER — LUPRON DEPOT (3-MONTH) 11.25 MG IM KIT: 11.2500 mg | PACK | INTRAMUSCULAR | 1 refills | Status: DC

## 2019-10-27 ENCOUNTER — Emergency Department: Payer: 59

## 2019-10-27 ENCOUNTER — Other Ambulatory Visit: Payer: Self-pay

## 2019-10-27 ENCOUNTER — Encounter: Payer: Self-pay | Admitting: Emergency Medicine

## 2019-10-27 ENCOUNTER — Emergency Department
Admission: EM | Admit: 2019-10-27 | Discharge: 2019-10-27 | Disposition: A | Discharge status: Home / Self Care | Payer: 59 | Attending: Student | Admitting: Student

## 2019-10-27 DIAGNOSIS — M25572 Pain in left ankle and joints of left foot: Secondary | ICD-10-CM | POA: Diagnosis not present

## 2019-10-27 DIAGNOSIS — Y92410 Unspecified street and highway as the place of occurrence of the external cause: Secondary | ICD-10-CM | POA: Diagnosis not present

## 2019-10-27 DIAGNOSIS — F17210 Nicotine dependence, cigarettes, uncomplicated: Secondary | ICD-10-CM | POA: Diagnosis not present

## 2019-10-27 DIAGNOSIS — Y999 Unspecified external cause status: Secondary | ICD-10-CM | POA: Diagnosis not present

## 2019-10-27 DIAGNOSIS — R519 Headache, unspecified: Secondary | ICD-10-CM | POA: Diagnosis not present

## 2019-10-27 DIAGNOSIS — S161XXA Strain of muscle, fascia and tendon at neck level, initial encounter: Secondary | ICD-10-CM | POA: Diagnosis not present

## 2019-10-27 DIAGNOSIS — Z79899 Other long term (current) drug therapy: Secondary | ICD-10-CM | POA: Diagnosis not present

## 2019-10-27 DIAGNOSIS — Y9389 Activity, other specified: Secondary | ICD-10-CM | POA: Diagnosis not present

## 2019-10-27 DIAGNOSIS — S39012A Strain of muscle, fascia and tendon of lower back, initial encounter: Secondary | ICD-10-CM

## 2019-10-27 DIAGNOSIS — S199XXA Unspecified injury of neck, initial encounter: Secondary | ICD-10-CM | POA: Diagnosis present

## 2019-10-27 MED ORDER — METOCLOPRAMIDE HCL 10 MG PO TABS
10.0000 mg | ORAL_TABLET | Freq: Once | ORAL | Status: AC
  Administered 2019-10-27: 17:00:00 10 mg via ORAL
  Filled 2019-10-27: qty 1

## 2019-10-27 MED ORDER — BACLOFEN 10 MG PO TABS: 10.0000 mg | ORAL_TABLET | Freq: Three times a day (TID) | ORAL | 0 refills | Status: AC | PRN

## 2019-10-27 MED ORDER — LIDOCAINE 5 % EX PTCH: 1.0000 | MEDICATED_PATCH | CUTANEOUS | 0 refills | Status: AC

## 2019-10-27 MED ORDER — PREDNISONE 20 MG PO TABS: ORAL_TABLET | ORAL | 0 refills | Status: DC

## 2019-10-27 MED ORDER — METOCLOPRAMIDE HCL 5 MG PO TABS: 5.0000 mg | ORAL_TABLET | Freq: Three times a day (TID) | ORAL | 0 refills | Status: DC | PRN

## 2019-10-27 MED ORDER — KETOROLAC TROMETHAMINE 30 MG/ML IJ SOLN
60.0000 mg | Freq: Once | INTRAMUSCULAR | Status: AC
  Administered 2019-10-27: 60 mg via INTRAMUSCULAR
  Filled 2019-10-27: qty 2

## 2019-10-27 MED ORDER — HYDROCODONE-ACETAMINOPHEN 5-325 MG PO TABS: 1.0000 | ORAL_TABLET | Freq: Three times a day (TID) | ORAL | 0 refills | Status: AC | PRN
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
## 2019-11-10 ENCOUNTER — Ambulatory Visit (INDEPENDENT_AMBULATORY_CARE_PROVIDER_SITE_OTHER): Payer: 59

## 2019-11-10 ENCOUNTER — Other Ambulatory Visit: Payer: Self-pay

## 2019-11-10 DIAGNOSIS — N809 Endometriosis, unspecified: Secondary | ICD-10-CM

## 2019-11-10 DIAGNOSIS — R102 Pelvic and perineal pain: Secondary | ICD-10-CM | POA: Diagnosis not present

## 2019-11-10 MED ORDER — LEUPROLIDE ACETATE (3 MONTH) 11.25 MG IM KIT
11.2500 mg | PACK | Freq: Once | INTRAMUSCULAR | Status: AC
  Administered 2019-11-10: 09:00:00 11.25 mg via INTRAMUSCULAR

## 2019-11-10 MED ORDER — LUPRON DEPOT (3-MONTH) 11.25 MG IM KIT: 11.2500 mg | PACK | INTRAMUSCULAR | 0 refills | Status: DC

## 2020-02-10 ENCOUNTER — Ambulatory Visit: Payer: 59

## 2020-02-10 ENCOUNTER — Other Ambulatory Visit: Payer: Self-pay

## 2020-02-10 NOTE — Progress Notes (Signed)
Pt came for lupron inj which was NOT given as it was not in office.  Apologized to pt several times.  Pt wasn't happy b/c she had called and was told it was here.  Adv pt I would call [facility] and check on it.  [facility] stated it was discontinues 11/13/19 which they didn't understand why.  Verbal order given; it is to be shipped to [place]; pt aware and is aware [facility] will contact her for copay.

## 2020-03-25 ENCOUNTER — Telehealth: Payer: Self-pay | Admitting: Obstetrics and Gynecology

## 2020-03-25 NOTE — Telephone Encounter (Signed)
Pt calling about her lupron shot and is very upset nobody has gotten back to her about it. Advised I would send urgent message back. Every nurse I asked said this needs to go to stanely who isnt here so called pt back to let her know it would be Monday before the message was seen.

## 2020-03-28 NOTE — Telephone Encounter (Signed)
Spoke w/[facility] to inquire about status of rx. They show the verbal that was called in on 02/10/20. No other action was taken. Per representative, there must have been a glitch in the system and no "Contact Patient" order was ever generated on this rx on their end. They will contact patient to verify payment and authorize shipment. I will also notify patient she can reach out to them at [phone] to authorize/schedule shipment.

## 2020-03-28 NOTE — Telephone Encounter (Signed)
Spoke w/patient. Advised patient of information below and gave [phone] for [facility]

## 2020-03-31 NOTE — Telephone Encounter (Signed)
Spoke w/prior auth department to give answers to PA Request. Advised that patient had PA for Lupron valid 08/06/2019-02/04/20.  The issue is she didn't get her first dose until 11/10/2019. Therefore, when it was time for her refill in June, the PA had expired. Per representative, PA Request must be completed and patients insurance plan changed as of 03/21/20 and now the preferred drug is orion or orlissa. Due to the patient having already received one dose of Lupron, we have to answer the question regarding if the patient is having recurrent symptoms.

## 2020-03-31 NOTE — Telephone Encounter (Signed)
Tried x 4 received recording that number was changed or disconnected. Sent pt message via my chart.

## 2020-03-31 NOTE — Telephone Encounter (Signed)
Patient is calling to follow up if we have received an authorization form for her lupron medication. Please advise

## 2020-04-04 ENCOUNTER — Telehealth: Payer: Self-pay

## 2020-04-04 NOTE — Telephone Encounter (Signed)
Is this the one you are taking care of?

## 2020-04-04 NOTE — Telephone Encounter (Signed)
Spoke w/Vickie @[facility]. Advised I had received and replied to fax from this morning in regards to answering Question #21 about patients bone density. Vickie is sending the information to the clinician for review.

## 2020-04-04 NOTE — Telephone Encounter (Signed)
Kaitlyn Hall from [facility] calling to request PA for her Lupron;  Can call [phone] to do PA over the phone or fax PA to [phone].

## 2020-04-11 ENCOUNTER — Telehealth: Payer: Self-pay

## 2020-04-11 NOTE — Telephone Encounter (Signed)
PA denied for Lupron Depot. [facility]MS is aware and will advise on plan

## 2020-04-11 NOTE — Telephone Encounter (Signed)
Notice of Adverse Determination received.  Prior authorization forLupron Depot has been denied. Pt has new insurance. Please advise next step.

## 2020-04-12 NOTE — Telephone Encounter (Signed)
She hasn't been seen since late last year we may need to revisit treatment plan either phone visit or in office in the next 1-4 weeks

## 2020-04-12 NOTE — Telephone Encounter (Signed)
Voicemail box is not set up unable to leave message.

## 2020-04-19 ENCOUNTER — Ambulatory Visit: Payer: Self-pay | Admitting: Obstetrics and Gynecology

## 2020-04-21 ENCOUNTER — Other Ambulatory Visit: Payer: Self-pay | Admitting: Obstetrics and Gynecology

## 2020-04-21 NOTE — Telephone Encounter (Signed)
Advise

## 2020-04-26 NOTE — Telephone Encounter (Signed)
PA was denied and an appeal letter was written by Dr. Bonney Aid. I spoke to [place] Pink, rep at [facility], [phone] She states the appeal letter was received on 04/20/20, turn around time is 15 business days. Appeal number is [phone].   I called pt and she is aware of updated information. She is also aware the Norethindrone Rx was sent to her pharmacy by AMS on 8/30.

## 2020-05-03 ENCOUNTER — Encounter: Payer: Self-pay | Admitting: Obstetrics and Gynecology

## 2020-05-03 ENCOUNTER — Telehealth: Payer: Self-pay

## 2020-05-03 ENCOUNTER — Other Ambulatory Visit: Payer: Self-pay

## 2020-05-03 ENCOUNTER — Ambulatory Visit (INDEPENDENT_AMBULATORY_CARE_PROVIDER_SITE_OTHER): Payer: 59 | Admitting: Obstetrics and Gynecology

## 2020-05-03 DIAGNOSIS — R102 Pelvic and perineal pain: Secondary | ICD-10-CM

## 2020-05-03 DIAGNOSIS — G8929 Other chronic pain: Secondary | ICD-10-CM

## 2020-05-03 MED ORDER — ORILISSA 150 MG PO TABS: 1.0000 | ORAL_TABLET | Freq: Every day | ORAL | 5 refills | Status: DC

## 2020-05-03 NOTE — Progress Notes (Signed)
I connected with Kaitlyn Hall on 05/03/20 at  9:50 AM EDT by telephone and verified that I am speaking with the correct person using two identifiers.   I discussed the limitations, risks, security and privacy concerns of performing an evaluation and management service by telephone and the availability of in person appointments. I also discussed with the patient that there may be a patient responsible charge related to this service. The patient expressed understanding and agreed to proceed.  The patient was at home I spoke with the patient from my workstation phone The names of people involved in this encounter were: Kaitlyn Hall , and Vena Austria   Obstetrics & Gynecology Office Visit   Chief Complaint:  Chief Complaint  Patient presents with  . Follow-up    Endometriosis    History of Present Illness: 39 y.o. [id number] presenting for medication follow up for a diagnosis of chronic pelvic pain secondary to presumptive endometriosis.  She is currently being managed with norethindrone.   The patient reports improvement in symptoms but continued pelvic discomfort.  Normal pelvic imaging as recently as 04/17/2019.  On her current medication regimen the patient has achieved amenorrhea.   She has not noted any side-effects or new symptoms.    Has felt ill, sore throat, sinus congestion, and cough.  No fevers or chills.  No known COVID exposures.    Review of Systems: Review of Systems  Constitutional: Negative.   Gastrointestinal: Positive for abdominal pain. Negative for blood in stool, constipation, diarrhea, heartburn, melena, nausea and vomiting.  Genitourinary: Negative.      Past Medical History:  Past Medical History:  Diagnosis Date  . Migraine     Past Surgical History:  Past Surgical History:  Procedure Laterality Date  . CESAREAN SECTION  2010  . TUBAL LIGATION  2010    Gynecologic History: No LMP recorded. (Menstrual status: Oral  contraceptives).  Obstetric History: [id number]  Family History:  History reviewed. No pertinent family history.  Social History:  Social History   Socioeconomic History  . Marital status: Single    Spouse name: Not on file  . Number of children: Not on file  . Years of education: Not on file  . Highest education level: Not on file  Occupational History  . Not on file  Tobacco Use  . Smoking status: Current Every Day Smoker    Packs/day: 1.00    Types: Cigarettes  . Smokeless tobacco: Never Used  Vaping Use  . Vaping Use: Never used  Substance and Sexual Activity  . Alcohol use: Yes    Comment: occasionally  . Drug use: No  . Sexual activity: Yes    Birth control/protection: Surgical  Other Topics Concern  . Not on file  Social History Narrative  . Not on file   Social Determinants of Health   Financial Resource Strain:   . Difficulty of Paying Living Expenses: Not on file  Food Insecurity:   . Worried About Programme researcher, broadcasting/film/video in the Last Year: Not on file  . Ran Out of Food in the Last Year: Not on file  Transportation Needs:   . Lack of Transportation (Medical): Not on file  . Lack of Transportation (Non-Medical): Not on file  Physical Activity:   . Days of Exercise per Week: Not on file  . Minutes of Exercise per Session: Not on file  Stress:   . Feeling of Stress : Not on file  Social Connections:   . Frequency of  Communication with Friends and Family: Not on file  . Frequency of Social Gatherings with Friends and Family: Not on file  . Attends Religious Services: Not on file  . Active Member of Clubs or Organizations: Not on file  . Attends Banker Meetings: Not on file  . Marital Status: Not on file  Intimate Partner Violence:   . Fear of Current or Ex-Partner: Not on file  . Emotionally Abused: Not on file  . Physically Abused: Not on file  . Sexually Abused: Not on file    Allergies:  Allergies  Allergen Reactions  . Codeine Hives     Medications: Prior to Admission medications   Medication Sig Start Date End Date Taking? Authorizing Provider  albuterol (VENTOLIN HFA) 108 (90 Base) MCG/ACT inhaler Inhale 2 puffs into the lungs every 6 (six) hours as needed for wheezing or shortness of breath. 06/12/19  Yes Darci Current, MD  ibuprofen (ADVIL,MOTRIN) 800 MG tablet Take 1 tablet (800 mg total) by mouth every 8 (eight) hours as needed for moderate pain. 02/10/18  Yes Vena Austria, MD  norethindrone (AYGESTIN) 5 MG tablet TAKE 1 TABLET BY MOUTH EVERY DAY 04/25/20  Yes Vena Austria, MD  metoCLOPramide (REGLAN) 5 MG tablet Take 1 tablet (5 mg total) by mouth every 8 (eight) hours as needed for up to 5 days for nausea or vomiting. 10/27/19 11/01/19  Menshew, Charlesetta Ivory, PA-C  SUMAtriptan (IMITREX) 50 MG tablet Take 1 tablet (50 mg total) by mouth once as needed for migraine. May repeat in 2 hours if headache persists or recurs. Patient not taking: Reported on 01/07/2018 03/24/16 06/11/19  Emily Filbert, MD    Physical Exam Vitals: There were no vitals filed for this visit. No LMP recorded. (Menstrual status: Oral contraceptives).  No physical exam as this was a remote telephone visit to promote social distancing during the current COVID-19 Pandemic  Immunization History  Administered Date(s) Administered  . Influenza Inj Mdck Quad Pf 04/26/2018  . Influenza,inj,Quad PF,6+ Mos 04/25/2019  . Pneumococcal Polysaccharide-23 07/08/2018  . Tdap 07/08/2018    Assessment: 39 y.o. [id number] follow up chronic pelvic pain  Plan: Problem List Items Addressed This Visit    None    Visit Diagnoses    Chronic pelvic pain in female    -  Primary     Orilissa samples today was not approved for Lupron or Orilissa in the past year.    Discussed hysterectomy as last resort  Discussed COVID testing and if positive monoclonal antibody infusion  Telephone Time 12:20min  .folllow   Vena Austria, MD,  [facility], [facility] 05/03/2020, 10:11 AM

## 2020-05-03 NOTE — Telephone Encounter (Signed)
Orilissa prescribed today by [facility]. I have also sent the Rx to [facility] per [employer] representative.

## 2020-05-03 NOTE — Telephone Encounter (Signed)
Kaitlyn Hall w/[facility] states thru research they identified a PA is required. She has faxed info to office. This is to be done thru [facility]. TD#[phone] ext [phone] M-F [phone]

## 2020-05-06 ENCOUNTER — Other Ambulatory Visit: Payer: Self-pay | Admitting: Obstetrics and Gynecology

## 2020-05-06 NOTE — Progress Notes (Signed)
Spoke with prior authorization peer review her plan will cover Eligard which is depo leuprolide

## 2020-05-07 ENCOUNTER — Other Ambulatory Visit: Payer: Self-pay

## 2020-05-07 ENCOUNTER — Emergency Department: Payer: 59

## 2020-05-07 ENCOUNTER — Emergency Department
Admission: EM | Admit: 2020-05-07 | Discharge: 2020-05-07 | Disposition: A | Discharge status: Home / Self Care | Payer: 59 | Attending: Emergency Medicine | Admitting: Emergency Medicine

## 2020-05-07 DIAGNOSIS — F17210 Nicotine dependence, cigarettes, uncomplicated: Secondary | ICD-10-CM | POA: Insufficient documentation

## 2020-05-07 DIAGNOSIS — U071 COVID-19: Secondary | ICD-10-CM

## 2020-05-07 DIAGNOSIS — R05 Cough: Secondary | ICD-10-CM | POA: Diagnosis present

## 2020-05-07 LAB — COMPREHENSIVE METABOLIC PANEL
ALT: 67 U/L — ABNORMAL HIGH (ref 0–44)
AST: 144 U/L — ABNORMAL HIGH (ref 15–41)
Albumin: 3.7 g/dL (ref 3.5–5.0)
Alkaline Phosphatase: 40 U/L (ref 38–126)
Anion gap: 9 (ref 5–15)
BUN: 15 mg/dL (ref 6–20)
CO2: 25 mmol/L (ref 22–32)
Calcium: 8.6 mg/dL — ABNORMAL LOW (ref 8.9–10.3)
Chloride: 101 mmol/L (ref 98–111)
Creatinine, Ser: 0.88 mg/dL (ref 0.44–1.00)
GFR calc Af Amer: >60 mL/min (ref >60)
GFR calc non Af Amer: >60 mL/min (ref >60)
Glucose, Bld: 111 mg/dL — ABNORMAL HIGH (ref 70–99)
Potassium: 4.2 mmol/L (ref 3.5–5.1)
Sodium: 135 mmol/L (ref 135–145)
Total Bilirubin: 0.4 mg/dL (ref 0.3–1.2)
Total Protein: 7.1 g/dL (ref 6.5–8.1)

## 2020-05-07 LAB — CBC WITH DIFFERENTIAL/PLATELET
Abs Immature Granulocytes: 0.02 10*3/uL (ref 0.00–0.07)
Basophils Absolute: 0 10*3/uL (ref 0.0–0.1)
Basophils Relative: 0 %
Eosinophils Absolute: 0 10*3/uL (ref 0.0–0.5)
Eosinophils Relative: 0 %
HCT: 43.5 % (ref 36.0–46.0)
Hemoglobin: 14.2 g/dL (ref 12.0–15.0)
Immature Granulocytes: 1 %
Lymphocytes Relative: 35 %
Lymphs Abs: 1.4 10*3/uL (ref 0.7–4.0)
MCH: 26.5 pg (ref 26.0–34.0)
MCHC: 32.6 g/dL (ref 30.0–36.0)
MCV: 81.2 fL (ref 80.0–100.0)
Monocytes Absolute: 0.3 10*3/uL (ref 0.1–1.0)
Monocytes Relative: 8 %
Neutro Abs: 2.2 10*3/uL (ref 1.7–7.7)
Neutrophils Relative %: 56 %
Platelets: 219 10*3/uL (ref 150–400)
RBC: 5.36 MIL/uL — ABNORMAL HIGH (ref 3.87–5.11)
RDW: 14.2 % (ref 11.5–15.5)
WBC: 4 10*3/uL (ref 4.0–10.5)
nRBC: 0 % (ref 0.0–0.2)

## 2020-05-07 MED ORDER — DEXTROMETHORPHAN POLISTIREX ER 30 MG/5ML PO SUER
60.0000 mg | Freq: Once | ORAL | Status: DC
  Filled 2020-05-07: qty 10

## 2020-05-07 MED ORDER — IBUPROFEN 800 MG PO TABS
800.0000 mg | ORAL_TABLET | Freq: Once | ORAL | Status: AC
  Administered 2020-05-07: 800 mg via ORAL
  Filled 2020-05-07: qty 1

## 2020-05-07 MED ORDER — PREDNISONE 20 MG PO TABS
60.0000 mg | ORAL_TABLET | Freq: Once | ORAL | Status: AC
  Administered 2020-05-07: 60 mg via ORAL
  Filled 2020-05-07: qty 3

## 2020-05-07 MED ORDER — SPACER/AERO-HOLDING CHAMBERS DEVI: 1.0000 | Freq: Once | 0 refills | Status: AC

## 2020-05-07 MED ORDER — ALBUTEROL SULFATE HFA 108 (90 BASE) MCG/ACT IN AERS: 2.0000 | INHALATION_SPRAY | Freq: Four times a day (QID) | RESPIRATORY_TRACT | 2 refills | Status: AC | PRN

## 2020-05-07 MED ORDER — PREDNISONE 10 MG PO TABS: ORAL_TABLET | ORAL | 0 refills | Status: AC

## 2020-05-07 MED ORDER — DEXTROMETHORPHAN-GUAIFENESIN 5-100 MG/5ML PO LIQD
10.0000 mL | Freq: Three times a day (TID) | ORAL | 0 refills | Status: DC
Start: 2020-05-07 — End: 2020-06-24

## 2020-05-07 NOTE — ED Provider Notes (Signed)
[facility] Emergency Department Provider Note  ____________________________________________   First MD Initiated Contact with Patient 05/07/20 2138     (approximate)  I have reviewed the triage vital signs and the nursing notes.   HISTORY  Chief Complaint Cough   HPI Kaitlyn Hall is a 39 y.o. female to the emergency department for eval ration following a positive Covid diagnosis.  The patient has been symptomatic for 6 days and was tested at an outpatient clinic 3 days ago.  She presents today for worsening symptoms of cough that is causing her to gag and vomit as well as back pain from the coughing.  She has associated body aches and fever.  She feels slightly short of breath particularly when walking a longer distance but denies chest pain.  She has been resting at home taking Tylenol and using cough drops but states that she does not feel like any of this is helping her symptoms.       Past Medical History:  Diagnosis Date  . Migraine     There are no problems to display for this patient.   Past Surgical History:  Procedure Laterality Date  . CESAREAN SECTION  2010  . TUBAL LIGATION  2010    Prior to Admission medications   Medication Sig Start Date End Date Taking? Authorizing Provider  albuterol (VENTOLIN HFA) 108 (90 Base) MCG/ACT inhaler Inhale 2 puffs into the lungs every 6 (six) hours as needed for wheezing or shortness of breath. 05/07/20   Lucy Chris, PA  Dextromethorphan-guaiFENesin 5-100 MG/5ML LIQD Take 10 mLs by mouth in the morning, at noon, and at bedtime. 05/07/20   Lucy Chris, PA  Elagolix Sodium (ORILISSA) 150 MG TABS Take 1 tablet by mouth daily. 05/03/20   Vena Austria, MD  ibuprofen (ADVIL,MOTRIN) 800 MG tablet Take 1 tablet (800 mg total) by mouth every 8 (eight) hours as needed for moderate pain. 02/10/18   Vena Austria, MD  metoCLOPramide (REGLAN) 5 MG tablet Take 1 tablet (5 mg total) by mouth every  8 (eight) hours as needed for up to 5 days for nausea or vomiting. 10/27/19 11/01/19  Menshew, Charlesetta Ivory, PA-C  norethindrone (AYGESTIN) 5 MG tablet TAKE 1 TABLET BY MOUTH EVERY DAY 04/25/20   Vena Austria, MD  predniSONE (DELTASONE) 10 MG tablet Take 6 tablets (60 mg total) by mouth daily for 1 day, THEN 5 tablets (50 mg total) daily for 1 day, THEN 4 tablets (40 mg total) daily for 1 day, THEN 3 tablets (30 mg total) daily for 1 day, THEN 2 tablets (20 mg total) daily for 1 day, THEN 1 tablet (10 mg total) daily for 1 day. 05/07/20 05/13/20  Lucy Chris, PA  Spacer/Aero-Holding Chambers DEVI 1 Device by Does not apply route once for 1 dose. 05/07/20 05/07/20  Lucy Chris, PA  SUMAtriptan (IMITREX) 50 MG tablet Take 1 tablet (50 mg total) by mouth once as needed for migraine. May repeat in 2 hours if headache persists or recurs. Patient not taking: Reported on 01/07/2018 03/24/16 06/11/19  Emily Filbert, MD    Allergies Codeine  No family history on file.  Social History Social History   Tobacco Use  . Smoking status: Current Every Day Smoker    Packs/day: 1.00    Types: Cigarettes  . Smokeless tobacco: Never Used  Vaping Use  . Vaping Use: Never used  Substance Use Topics  . Alcohol use: Yes    Comment:  occasionally  . Drug use: No    Review of Systems Constitutional: + fever/chills, + body aches Eyes: No visual changes. ENT: + sore throat. Cardiovascular: Denies chest pain. Respiratory: + shortness of breath, + cough Gastrointestinal: No abdominal pain.  No nausea, + vomiting.  No diarrhea.  No constipation. Genitourinary: Negative for dysuria. Musculoskeletal: Negative for back pain. Skin: Negative for rash. Neurological: + headaches, negative for focal weakness or numbness.   ____________________________________________   PHYSICAL EXAM:  VITAL SIGNS: ED Triage Vitals  Enc Vitals Group     BP 05/07/20 2134 125/87     Pulse Rate 05/07/20 2134  98     Resp 05/07/20 2134 20     Temp 05/07/20 2134 (!) 101 F (38.3 C)     Temp Source 05/07/20 2134 Oral     SpO2 05/07/20 2134 97 %     Weight 05/07/20 2132 250 lb (113.4 kg)     Height 05/07/20 2132 5\' 9"  (1.753 m)     Head Circumference --      Peak Flow --      Pain Score --      Pain Loc --      Pain Edu? --      Excl. in GC? --     Constitutional: Alert and oriented.  Ill appearing lying wrapped in blankets. Eyes: Conjunctivae are injected bilaterally. PERRL. EOMI. Head: Atraumatic. Nose: + congestion/rhinnorhea. Mouth/Throat: Mucous membranes are moist.  Oropharynx erythematous without exudate Neck: No stridor.   Lymphatic: No cervical lymphadenopathy Cardiovascular: Normal rate, regular rhythm. Grossly normal heart sounds.  Good peripheral circulation. Respiratory: Normal respiratory effort.  No retractions. Lungs CTAB. Gastrointestinal: Soft and nontender. No distention. No abdominal bruits. No CVA tenderness. Musculoskeletal: No lower extremity tenderness nor edema.  No joint effusions. Neurologic:  Normal speech and language. No gross focal neurologic deficits are appreciated. No gait instability. Skin:  Skin is warm, dry and intact. No rash noted. Psychiatric: Mood and affect are normal. Speech and behavior are normal.  ____________________________________________   LABS (all labs ordered are listed, but only abnormal results are displayed)  Labs Reviewed  CBC WITH DIFFERENTIAL/PLATELET - Abnormal; Notable for the following components:      Result Value   RBC 5.36 (*)    All other components within normal limits  COMPREHENSIVE METABOLIC PANEL - Abnormal; Notable for the following components:   Glucose, Bld 111 (*)    Calcium 8.6 (*)    AST 144 (*)    ALT 67 (*)    All other components within normal limits   ____________________________________________  RADIOLOGY   Official radiology report(s): DG Chest Port 1 View  Result Date: 05/07/2020 CLINICAL  DATA:  COVID positive on Thursday with symptoms starting last Sunday. Weakness, cough, and vomiting. EXAM: PORTABLE CHEST 1 VIEW COMPARISON:  06/11/2019 FINDINGS: Shallow inspiration. Heart size and pulmonary vascularity are normal. Increased density in the lung bases is likely due to soft tissue attenuation. Lungs appear clear and expanded. No pleural effusions. No pneumothorax. Mediastinal contours appear intact. IMPRESSION: No active disease. Electronically Signed   By: 06/13/2019 M.D.   On: 05/07/2020 22:17     ____________________________________________   INITIAL IMPRESSION / ASSESSMENT AND PLAN / ED COURSE  As part of my medical decision making, I reviewed the following data within the electronic MEDICAL RECORD NUMBER Nursing notes reviewed and incorporated        Kaitlyn Hall is a 39 year old female who presents to the emergency department for  evaluation following positive Covid result 3 days ago.  The patient has had worsening symptoms of body aches, chills, cough and shortness of breath.  On physical exam, patient is febrile but is not tachycardic and has a SPO2 of 97%.  The patient had an ambulation challenge with oxygen saturation remaining above 95% at all times.  The physical exam is reassuring particularly of the lungs which are clear to auscultation with no wheezing rales or rhonchi noted.  Chest x-ray is read by radiology as normal.  CMP and CBC are grossly normal with no elevated white count.  At this time, I feel the patient is stable for outpatient management of Covid.  Patient will be prescribed a prednisone taper as well as albuterol inhaler with spacer as well as cough suppressant.  The patient was given strict return precautions that she should return if she experiences worsening symptoms despite treatment particularly if she has increased work of breathing or shortness of breath that worsens.  Patient acknowledges this and is amenable with outpatient therapy.         ____________________________________________   FINAL CLINICAL IMPRESSION(S) / ED DIAGNOSES  Final diagnoses:  COVID-19     ED Discharge Orders         Ordered    predniSONE (DELTASONE) 10 MG tablet        05/07/20 2258    albuterol (VENTOLIN HFA) 108 (90 Base) MCG/ACT inhaler  Every 6 hours PRN        05/07/20 2258    Spacer/Aero-Holding Chambers DEVI   Once        05/07/20 2258    Dextromethorphan-guaiFENesin 5-100 MG/5ML LIQD  3 times daily        05/07/20 2258          *Please note:  Kaitlyn Hall was evaluated in Emergency Department on 05/07/2020 for the symptoms described in the history of present illness. She was evaluated in the context of the global COVID-19 pandemic, which necessitated consideration that the patient might be at risk for infection with the SARS-CoV-2 virus that causes COVID-19. Institutional protocols and algorithms that pertain to the evaluation of patients at risk for COVID-19 are in a state of rapid change based on information released by regulatory bodies including the CDC and federal and state organizations. These policies and algorithms were followed during the patient's care in the ED.  Some ED evaluations and interventions may be delayed as a result of limited staffing during and the pandemic.*   Note:  This document was prepared using Dragon voice recognition software and may include unintentional dictation errors.    Lucy Chris, PA 05/07/20 2308    Sharman Cheek, MD 05/08/20 0006

## 2020-05-07 NOTE — ED Notes (Signed)
Pt ambulated with pulse ox.  Prior to ambulation, O2 sats 96% on room air, HR 96.  During ambulation, O2 sat stayed above 95%, HR 110-120.  Pt's gait was slow but steady.  Pt stated she started to feel a little dizzy after several minutes, and she has been experiencing this at home as well (dizziness when getting up and moving around).

## 2020-05-07 NOTE — ED Notes (Signed)
FIRST NURSE NOTE: Pt states she had positive COVID test at [facility] on 9/9.

## 2020-05-07 NOTE — ED Triage Notes (Signed)
Patient reports COVID + on Thursday with symptoms starting last Sunday.  Reports feels weak an coughing so much she vomits and it makes her back hurt.

## 2020-05-07 NOTE — ED Notes (Signed)
Per pharmacy- will verify and send if not loaded in pyxis.

## 2020-05-10 NOTE — Progress Notes (Signed)
Notice of approval received through [facility] regarding Lupron Depot-3 month 11.25mg . Approved from 04/08/20-10/09/20.   Staebler please reorder medication. I must print Rx and fax it with approval letter to [facility].

## 2020-05-17 ENCOUNTER — Telehealth: Payer: Self-pay | Admitting: Obstetrics and Gynecology

## 2020-05-17 NOTE — Telephone Encounter (Signed)
Erie Noe with [employer] calling regarding Pollie Friar denial.  She is calling for reason for denial and seeing if maybe could appeal.  cb [phone].

## 2020-06-14 ENCOUNTER — Ambulatory Visit: Payer: 59 | Admitting: Obstetrics and Gynecology

## 2020-06-24 ENCOUNTER — Other Ambulatory Visit: Payer: Self-pay

## 2020-06-24 ENCOUNTER — Ambulatory Visit (INDEPENDENT_AMBULATORY_CARE_PROVIDER_SITE_OTHER): Payer: 59 | Admitting: Obstetrics and Gynecology

## 2020-06-24 ENCOUNTER — Encounter: Payer: Self-pay | Admitting: Obstetrics and Gynecology

## 2020-06-24 VITALS — BP 130/90 | Ht 69.0 in | Wt 264.0 lb

## 2020-06-24 DIAGNOSIS — N946 Dysmenorrhea, unspecified: Secondary | ICD-10-CM | POA: Diagnosis not present

## 2020-06-24 DIAGNOSIS — G8929 Other chronic pain: Secondary | ICD-10-CM | POA: Diagnosis not present

## 2020-06-24 DIAGNOSIS — R102 Pelvic and perineal pain: Secondary | ICD-10-CM | POA: Diagnosis not present

## 2020-06-24 NOTE — Progress Notes (Signed)
Obstetrics & Gynecology Office Visit   Chief Complaint:  Chief Complaint  Patient presents with  . Follow-up    medication    History of Present Illness: 39 y.o. [id number] presenting for medication follow up for a diagnosis of pelvic pain dysmenorrhea, presumptive endometriosis.  She is currently being managed with norethindrone.   The patient reports no improvement in symptoms.  On her current medication regimen has intermittent breakthrough bleeding.   She has not noted any side-effects or new symptoms.    Review of Systems: Review of Systems  Constitutional: Negative.   Gastrointestinal: Positive for abdominal pain. Negative for blood in stool, constipation, diarrhea, heartburn, melena, nausea and vomiting.  Genitourinary: Negative.   Neurological: Positive for headaches.     Past Medical History:  Past Medical History:  Diagnosis Date  . Migraine     Past Surgical History:  Past Surgical History:  Procedure Laterality Date  . CESAREAN SECTION  2010  . TUBAL LIGATION  2010    Gynecologic History: No LMP recorded. (Menstrual status: Oral contraceptives).  Obstetric History: [id number]  Family History:  History reviewed. No pertinent family history.  Social History:  Social History   Socioeconomic History  . Marital status: Single    Spouse name: Not on file  . Number of children: Not on file  . Years of education: Not on file  . Highest education level: Not on file  Occupational History  . Not on file  Tobacco Use  . Smoking status: Current Every Day Smoker    Packs/day: 1.00    Types: Cigarettes  . Smokeless tobacco: Never Used  Vaping Use  . Vaping Use: Never used  Substance and Sexual Activity  . Alcohol use: Yes    Comment: occasionally  . Drug use: No  . Sexual activity: Yes    Birth control/protection: Surgical  Other Topics Concern  . Not on file  Social History Narrative  . Not on file   Social Determinants of Health   Financial  Resource Strain:   . Difficulty of Paying Living Expenses: Not on file  Food Insecurity:   . Worried About Programme researcher, broadcasting/film/video in the Last Year: Not on file  . Ran Out of Food in the Last Year: Not on file  Transportation Needs:   . Lack of Transportation (Medical): Not on file  . Lack of Transportation (Non-Medical): Not on file  Physical Activity:   . Days of Exercise per Week: Not on file  . Minutes of Exercise per Session: Not on file  Stress:   . Feeling of Stress : Not on file  Social Connections:   . Frequency of Communication with Friends and Family: Not on file  . Frequency of Social Gatherings with Friends and Family: Not on file  . Attends Religious Services: Not on file  . Active Member of Clubs or Organizations: Not on file  . Attends Banker Meetings: Not on file  . Marital Status: Not on file  Intimate Partner Violence:   . Fear of Current or Ex-Partner: Not on file  . Emotionally Abused: Not on file  . Physically Abused: Not on file  . Sexually Abused: Not on file    Allergies:  Allergies  Allergen Reactions  . Codeine Hives    Medications: Prior to Admission medications   Medication Sig Start Date End Date Taking? Authorizing Provider  albuterol (VENTOLIN HFA) 108 (90 Base) MCG/ACT inhaler Inhale 2 puffs into the lungs every  6 (six) hours as needed for wheezing or shortness of breath. 05/07/20  Yes Lucy Chris, PA  diclofenac Sodium (VOLTAREN) 1 % GEL diclofenac 1 % topical gel  APPLY 1 GRAMS TO THE AFFECTED AREA(S) BY TOPICAL ROUTE 4 TIMES PER DAY   Yes [provider]  Elagolix Sodium (ORILISSA) 150 MG TABS Take 1 tablet by mouth daily. 05/03/20  Yes Vena Austria, MD  ibuprofen (ADVIL,MOTRIN) 800 MG tablet Take 1 tablet (800 mg total) by mouth every 8 (eight) hours as needed for moderate pain. 02/10/18  Yes Vena Austria, MD  norethindrone (AYGESTIN) 5 MG tablet TAKE 1 TABLET BY MOUTH EVERY DAY 04/25/20  Yes Vena Austria, MD  LUPRON DEPOT, 65-MONTH, 11.25 MG injection Inject into the muscle. Patient not taking: Reported on 06/24/2020 05/23/20   [provider]  SUMAtriptan (IMITREX) 50 MG tablet Take 1 tablet (50 mg total) by mouth once as needed for migraine. May repeat in 2 hours if headache persists or recurs. Patient not taking: Reported on 01/07/2018 03/24/16 06/11/19  Emily Filbert, MD    Physical Exam Vitals:  Vitals:   06/24/20 1112  BP: 130/90   No LMP recorded. (Menstrual status: Oral contraceptives).  General: NAD, well nourished appears stated age HEENT: normocephalic, anicteric Pulmonary: No increased work of breathing Neurologic: Grossly intact Psychiatric: mood appropriate, affect full  Assessment: 39 y.o. [id number] follow up chronic pelvic pain  Plan: Problem List Items Addressed This Visit    None    Visit Diagnoses    Dysmenorrhea    -  Primary   Chronic pelvic pain in female         1) Chronic pelvic pain - currently on norethindrone - failed prior authorization for depo lurpon or orilissa - normal TVUS 02/07/2018 and 04/17/2019 - pap UTD 01/07/2018 NILM HPV negative - proceed with diagnostic laparoscopy  2) A total of 15 minutes were spent in face-to-face contact with the patient during this encounter with over half of that time devoted to counseling and coordination of care.  3) Return if symptoms worsen or fail to improve.   Vena Austria, MD, [facility], [facility]

## 2020-06-29 ENCOUNTER — Telehealth: Payer: Self-pay | Admitting: Obstetrics and Gynecology

## 2020-06-29 NOTE — Telephone Encounter (Signed)
Called patient to schedule Diag Laparoscopy w [facility]  DOS 11/23  H&P 11/15 @ 8:10   Covid testing 11/19 @ 8-10:30, [address], drive up and wear mask. Advised pt to quarantine until DOS.  Pre-admit phone call appointment to be requested - date and time will be included on H&P paper work. Also all appointments will be updated on pt MyChart. Explained that this appointment has a call window. Based on the time scheduled will indicate if the call will be received within a 4 hour window before 1:00 or after.  Advised that pt may also receive calls from the hospital pharmacy and pre-service center.  Confirmed pt has Community education officer as Editor, commissioning. No secondary insurance.  _______________________________  Current issues that patient asked about...  Pt adv that she was having "some bleeding" but that the amount she was having (noticed when wiping) is not equivalent to the "pain and cramping" she has been experiencing. She adv this is the first bleeding she's had in about a year. She also adv that she had run out of the ORILISSA samples that [facility] had given to her at her last appt. She wasn't sure if the bleeding was a result of her stopping that drug or not. I asked if she felt like the ORILISSA helped to which she replied "not really".  I adv that [facility] was on vacation this week but that I would pass her concerns/questions on to our triage nurse and that someone would get back with her.

## 2020-06-29 NOTE — Telephone Encounter (Signed)
Left detailed msg as recording said 'this is Kaitlyn Hall"; also adv can take 600mg  IBU q6hrs or 800mg  q8hrs, heating pad, lay on pillow.

## 2020-06-29 NOTE — Telephone Encounter (Signed)
-----   Message from Vena Austria, MD sent at 06/24/2020 10:15 PM EDT ----- Regarding: Surgery Surgery Booking Request Patient Full Name:  Kaitlyn Hall  MRN: [id number]  DOB: 09-12-1980  Surgeon: Vena Austria, MD 2-6 weeks Primary Diagnosis AND Code: Chronic pelvic pain Secondary Diagnosis and Code: Dysmenorrhea Surgical Procedure: Diagnostic Laparoscopy RNFA Requested?: No L&D Notification: No Admission Status: same day surgery Length of Surgery: 75 min Special Case Needs: No H&P: Yes Phone Interview???:  No Interpreter: No Medical Clearance:  No Special Scheduling Instructions: No Any known health/anesthesia issues, diabetes, sleep apnea, latex allergy, defibrillator/pacemaker?: No Acuity: P3   (P1 highest, P2 delay may cause harm, P3 low, elective gyn, P4 lowest)

## 2020-07-04 NOTE — Telephone Encounter (Signed)
Yes related to stopping Kaitlyn Hall

## 2020-07-11 ENCOUNTER — Ambulatory Visit (INDEPENDENT_AMBULATORY_CARE_PROVIDER_SITE_OTHER): Payer: 59 | Admitting: Obstetrics and Gynecology

## 2020-07-11 ENCOUNTER — Other Ambulatory Visit: Payer: Self-pay

## 2020-07-11 ENCOUNTER — Encounter: Payer: Self-pay | Admitting: Obstetrics and Gynecology

## 2020-07-11 VITALS — BP 138/86 | Ht 69.0 in | Wt 271.0 lb

## 2020-07-11 DIAGNOSIS — Z01818 Encounter for other preprocedural examination: Secondary | ICD-10-CM

## 2020-07-11 NOTE — H&P (View-Only) (Signed)
Obstetrics & Gynecology Surgery H&P    Chief Complaint: Scheduled Surgery   History of Present Illness: Patient is a 39 y.o. B1D1761 presenting for scheduled diagnostic laparoscopy, for the treatment or further evaluation of chronic pelvic pain.   Prior Treatments prior to proceeding with surgery include: norethindrone, unable to get orilissa or lupron approval  Preoperative Pap: 01/07/2018 NILM HPV negative Preoperative Endometrial biopsy: N/A Preoperative Ultrasound: 04/17/2019 normal transvaginal ultrasound   Review of Systems:10 point review of systems  Past Medical History:  There are no problems to display for this patient.   Past Surgical History:  Past Surgical History:  Procedure Laterality Date  . CESAREAN SECTION  2010  . TUBAL LIGATION  2010    Family History:  No family history on file.  Social History:  Social History   Socioeconomic History  . Marital status: Single    Spouse name: Not on file  . Number of children: Not on file  . Years of education: Not on file  . Highest education level: Not on file  Occupational History  . Not on file  Tobacco Use  . Smoking status: Current Every Day Smoker    Packs/day: 1.00    Types: Cigarettes  . Smokeless tobacco: Never Used  Vaping Use  . Vaping Use: Never used  Substance and Sexual Activity  . Alcohol use: Yes    Comment: occasionally  . Drug use: No  . Sexual activity: Yes    Birth control/protection: Surgical  Other Topics Concern  . Not on file  Social History Narrative  . Not on file   Social Determinants of Health   Financial Resource Strain:   . Difficulty of Paying Living Expenses: Not on file  Food Insecurity:   . Worried About Programme researcher, broadcasting/film/video in the Last Year: Not on file  . Ran Out of Food in the Last Year: Not on file  Transportation Needs:   . Lack of Transportation (Medical): Not on file  . Lack of Transportation (Non-Medical): Not on file  Physical Activity:   . Days of  Exercise per Week: Not on file  . Minutes of Exercise per Session: Not on file  Stress:   . Feeling of Stress : Not on file  Social Connections:   . Frequency of Communication with Friends and Family: Not on file  . Frequency of Social Gatherings with Friends and Family: Not on file  . Attends Religious Services: Not on file  . Active Member of Clubs or Organizations: Not on file  . Attends Banker Meetings: Not on file  . Marital Status: Not on file  Intimate Partner Violence:   . Fear of Current or Ex-Partner: Not on file  . Emotionally Abused: Not on file  . Physically Abused: Not on file  . Sexually Abused: Not on file    Allergies:  Allergies  Allergen Reactions  . Codeine Hives    Medications: Prior to Admission medications   Medication Sig Start Date End Date Taking? Authorizing Provider  acetaminophen (TYLENOL) 500 MG tablet Take 1,000 mg by mouth every 6 (six) hours as needed for moderate pain.    [provider]  albuterol (VENTOLIN HFA) 108 (90 Base) MCG/ACT inhaler Inhale 2 puffs into the lungs every 6 (six) hours as needed for wheezing or shortness of breath. 05/07/20   Lucy Chris, PA  diclofenac Sodium (VOLTAREN) 1 % GEL diclofenac 1 % topical gel  APPLY 1 GRAMS TO THE AFFECTED AREA(S)  BY TOPICAL ROUTE 4 TIMES PER DAY Patient not taking: Reported on 07/07/2020    [provider]  Elagolix Sodium (ORILISSA) 150 MG TABS Take 1 tablet by mouth daily. Patient not taking: Reported on 07/07/2020 05/03/20   Vena Austria, MD  ibuprofen (ADVIL) 200 MG tablet Take 400 mg by mouth every 6 (six) hours as needed for moderate pain.    [provider]  ibuprofen (ADVIL,MOTRIN) 800 MG tablet Take 1 tablet (800 mg total) by mouth every 8 (eight) hours as needed for moderate pain. Patient not taking: Reported on 07/07/2020 02/10/18   Vena Austria, MD  LUPRON DEPOT, 13-MONTH, 11.25 MG injection Inject into the muscle. Patient not  taking: Reported on 06/24/2020 05/23/20   [provider]  norethindrone (AYGESTIN) 5 MG tablet TAKE 1 TABLET BY MOUTH EVERY DAY Patient taking differently: Take 5 mg by mouth daily.  04/25/20   Vena Austria, MD  SUMAtriptan (IMITREX) 50 MG tablet Take 1 tablet (50 mg total) by mouth once as needed for migraine. May repeat in 2 hours if headache persists or recurs. Patient not taking: Reported on 01/07/2018 03/24/16 06/11/19  Emily Filbert, MD    Physical Exam Vitals: Blood pressure 138/86, height 5\' 9"  (1.753 m), weight 271 lb (122.9 kg).   General: NAD HEENT: normocephalic, anicteric Pulmonary: No increased work of breathing Cardiovascular: RRR, distal pulses 2+ Abdomen: soft, non-tender, non-distended Genitourinary: deferred Extremities: no edema, erythema, or tenderness Neurologic: Grossly intact Psychiatric: mood appropriate, affect full  Imaging No results found.  Assessment: 39 y.o. 20 presenting for scheduled diagnostic laparoscopy  Plan: 1) I have had a careful discussion with this patient about all the options available and the risk/benefits of each. I have fully informed this patient that a laparoscopy may subject her to a variety of discomforts and risks: She understands that most patients have surgery with little difficulty, but problems can happen ranging from minor to fatal. These include nausea, vomiting, pain, bleeding, infection, poor healing, hernia, or formation of adhesions. Unexpected reactions may occur from any drug or anesthetic given. Unintended injury may occur to other pelvic or abdominal structures such as Fallopian tubes, ovaries, bladder, ureter (tube from kidney to bladder), or bowel. Nerves going from the pelvis to the legs may be injured. Any such injury may require immediate or later additional surgery to correct the problem. Excessive blood loss requiring transfusion is very unlikely but possible. Dangerous blood clots may form in  the legs or lungs. Physical and sexual activity will be restricted in varying degrees for an indeterminate period of time but most often 2-4 weeks. She understands that the plan is to do this laparoscopically, however, there is a chance that this will need to be performed via a larger incision. Finally, she understands that it is impossible to list every possible undesirable effect and that the condition for which surgery is done is not always cured or significantly improved, and in rare cases may be even worsen. Ample time was given to answer all questions.   2) Routine postoperative instructions were reviewed with the patient and her family in detail today including the expected length of recovery and likely postoperative course.  The patient concurred with the proposed plan, giving informed written consent for the surgery today.  Patient instructed on the importance of being NPO after midnight prior to her procedure.  If warranted preoperative prophylactic antibiotics and SCDs ordered on call to the OR to meet SCIP guidelines and adhere to recommendation laid forth  in ACOG Practice Bulletin Number 104 May 2009  "Antibiotic Prophylaxis for Gynecologic Procedures".     Vena Austria, MD, [facility], [facility] 07/11/2020, 8:10 AM

## 2020-07-11 NOTE — Progress Notes (Signed)
Obstetrics & Gynecology Surgery H&P    Chief Complaint: Scheduled Surgery   History of Present Illness: Patient is a 39 y.o. B1D1761 presenting for scheduled diagnostic laparoscopy, for the treatment or further evaluation of chronic pelvic pain.   Prior Treatments prior to proceeding with surgery include: norethindrone, unable to get orilissa or lupron approval  Preoperative Pap: 01/07/2018 NILM HPV negative Preoperative Endometrial biopsy: N/A Preoperative Ultrasound: 04/17/2019 normal transvaginal ultrasound   Review of Systems:10 point review of systems  Past Medical History:  There are no problems to display for this patient.   Past Surgical History:  Past Surgical History:  Procedure Laterality Date  . CESAREAN SECTION  2010  . TUBAL LIGATION  2010    Family History:  No family history on file.  Social History:  Social History   Socioeconomic History  . Marital status: Single    Spouse name: Not on file  . Number of children: Not on file  . Years of education: Not on file  . Highest education level: Not on file  Occupational History  . Not on file  Tobacco Use  . Smoking status: Current Every Day Smoker    Packs/day: 1.00    Types: Cigarettes  . Smokeless tobacco: Never Used  Vaping Use  . Vaping Use: Never used  Substance and Sexual Activity  . Alcohol use: Yes    Comment: occasionally  . Drug use: No  . Sexual activity: Yes    Birth control/protection: Surgical  Other Topics Concern  . Not on file  Social History Narrative  . Not on file   Social Determinants of Health   Financial Resource Strain:   . Difficulty of Paying Living Expenses: Not on file  Food Insecurity:   . Worried About Programme researcher, broadcasting/film/video in the Last Year: Not on file  . Ran Out of Food in the Last Year: Not on file  Transportation Needs:   . Lack of Transportation (Medical): Not on file  . Lack of Transportation (Non-Medical): Not on file  Physical Activity:   . Days of  Exercise per Week: Not on file  . Minutes of Exercise per Session: Not on file  Stress:   . Feeling of Stress : Not on file  Social Connections:   . Frequency of Communication with Friends and Family: Not on file  . Frequency of Social Gatherings with Friends and Family: Not on file  . Attends Religious Services: Not on file  . Active Member of Clubs or Organizations: Not on file  . Attends Banker Meetings: Not on file  . Marital Status: Not on file  Intimate Partner Violence:   . Fear of Current or Ex-Partner: Not on file  . Emotionally Abused: Not on file  . Physically Abused: Not on file  . Sexually Abused: Not on file    Allergies:  Allergies  Allergen Reactions  . Codeine Hives    Medications: Prior to Admission medications   Medication Sig Start Date End Date Taking? Authorizing Provider  acetaminophen (TYLENOL) 500 MG tablet Take 1,000 mg by mouth every 6 (six) hours as needed for moderate pain.    [provider]  albuterol (VENTOLIN HFA) 108 (90 Base) MCG/ACT inhaler Inhale 2 puffs into the lungs every 6 (six) hours as needed for wheezing or shortness of breath. 05/07/20   Lucy Chris, PA  diclofenac Sodium (VOLTAREN) 1 % GEL diclofenac 1 % topical gel  APPLY 1 GRAMS TO THE AFFECTED AREA(S)  BY TOPICAL ROUTE 4 TIMES PER DAY Patient not taking: Reported on 07/07/2020    [provider]  Elagolix Sodium (ORILISSA) 150 MG TABS Take 1 tablet by mouth daily. Patient not taking: Reported on 07/07/2020 05/03/20   Vena Austria, MD  ibuprofen (ADVIL) 200 MG tablet Take 400 mg by mouth every 6 (six) hours as needed for moderate pain.    [provider]  ibuprofen (ADVIL,MOTRIN) 800 MG tablet Take 1 tablet (800 mg total) by mouth every 8 (eight) hours as needed for moderate pain. Patient not taking: Reported on 07/07/2020 02/10/18   Vena Austria, MD  LUPRON DEPOT, 13-MONTH, 11.25 MG injection Inject into the muscle. Patient not  taking: Reported on 06/24/2020 05/23/20   [provider]  norethindrone (AYGESTIN) 5 MG tablet TAKE 1 TABLET BY MOUTH EVERY DAY Patient taking differently: Take 5 mg by mouth daily.  04/25/20   Vena Austria, MD  SUMAtriptan (IMITREX) 50 MG tablet Take 1 tablet (50 mg total) by mouth once as needed for migraine. May repeat in 2 hours if headache persists or recurs. Patient not taking: Reported on 01/07/2018 03/24/16 06/11/19  Emily Filbert, MD    Physical Exam Vitals: Blood pressure 138/86, height 5\' 9"  (1.753 m), weight 271 lb (122.9 kg).   General: NAD HEENT: normocephalic, anicteric Pulmonary: No increased work of breathing Cardiovascular: RRR, distal pulses 2+ Abdomen: soft, non-tender, non-distended Genitourinary: deferred Extremities: no edema, erythema, or tenderness Neurologic: Grossly intact Psychiatric: mood appropriate, affect full  Imaging No results found.  Assessment: Kaitlyn y.o. 21 presenting for scheduled diagnostic laparoscopy  Plan: 1) I have had a careful discussion with this patient about all the options available and the risk/benefits of each. I have fully informed this patient that a laparoscopy may subject her to a variety of discomforts and risks: She understands that most patients have surgery with little difficulty, but problems can happen ranging from minor to fatal. These include nausea, vomiting, pain, bleeding, infection, poor healing, hernia, or formation of adhesions. Unexpected reactions may occur from any drug or anesthetic given. Unintended injury may occur to other pelvic or abdominal structures such as Fallopian tubes, ovaries, bladder, ureter (tube from kidney to bladder), or bowel. Nerves going from the pelvis to the legs may be injured. Any such injury may require immediate or later additional surgery to correct the problem. Excessive blood loss requiring transfusion is very unlikely but possible. Dangerous blood clots may form in  the legs or lungs. Physical and sexual activity will be restricted in varying degrees for an indeterminate period of time but most often 2-4 weeks. She understands that the plan is to do this laparoscopically, however, there is a chance that this will need to be performed via a larger incision. Finally, she understands that it is impossible to list every possible undesirable effect and that the condition for which surgery is done is not always cured or significantly improved, and in rare cases may be even worsen. Ample time was given to answer all questions.   2) Routine postoperative instructions were reviewed with the patient and her family in detail today including the expected length of recovery and likely postoperative course.  The patient concurred with the proposed plan, giving informed written consent for the surgery today.  Patient instructed on the importance of being NPO after midnight prior to her procedure.  If warranted preoperative prophylactic antibiotics and SCDs ordered on call to the OR to meet SCIP guidelines and adhere to recommendation laid forth  in ACOG Practice Bulletin Number 104 May 2009  "Antibiotic Prophylaxis for Gynecologic Procedures".     Vena Austria, MD, [facility], [facility] 07/11/2020, 8:10 AM

## 2020-07-12 ENCOUNTER — Encounter
Admission: RE | Admit: 2020-07-12 | Discharge: 2020-07-12 | Disposition: A | Discharge status: Home / Self Care | Payer: 59 | Source: Ambulatory Visit | Attending: Obstetrics and Gynecology | Admitting: Obstetrics and Gynecology

## 2020-07-12 HISTORY — DX: Anemia, unspecified: D64.9

## 2020-07-12 HISTORY — DX: Unspecified chronic bronchitis: J42

## 2020-07-12 HISTORY — DX: Gastro-esophageal reflux disease without esophagitis: K21.9

## 2020-07-12 NOTE — Patient Instructions (Addendum)
Your procedure is scheduled on: 07/19/20 Report to DAY SURGERY DEPARTMENT LOCATED ON 2ND FLOOR MEDICAL MALL ENTRANCE. To find out your arrival time please call [phone] between 1PM - 3PM on 07/18/20.  Remember: Instructions that are not followed completely may result in serious medical risk, up to and including death, or upon the discretion of your surgeon and anesthesiologist your surgery may need to be rescheduled.     _X__ 1. Do not eat food after midnight the night before your procedure.                 No gum chewing or hard candies. You may drink clear liquids up to 2 hours                 before you are scheduled to arrive for your surgery- DO not drink clear                 liquids within 2 hours of the start of your surgery.                 Clear Liquids include:  water, apple juice without pulp, clear carbohydrate                 drink such as Clearfast or Gatorade, Black Coffee or Tea (Do not add                 anything to coffee or tea). Diabetics water only  __X__2.  On the morning of surgery brush your teeth with toothpaste and water, you                 may rinse your mouth with mouthwash if you wish.  Do not swallow any              toothpaste of mouthwash.     _X__ 3.  No Alcohol for 24 hours before or after surgery.   _X__ 4.  Do Not Smoke or use e-cigarettes For 24 Hours Prior to Your Surgery.                 Do not use any chewable tobacco products for at least 6 hours prior to                 surgery.  ____  5.  Bring all medications with you on the day of surgery if instructed.   __X__  6.  Notify your doctor if there is any change in your medical condition      (cold, fever, infections).     Do not wear jewelry, make-up, hairpins, clips or nail polish. Do not wear lotions, powders, or perfumes.  Do not shave 48 hours prior to surgery. Men may shave face and neck. Do not bring valuables to the hospital.    [facility] is not responsible for any belongings  or valuables.  Contacts, dentures/partials or body piercings may not be worn into surgery. Bring a case for your contacts, glasses or hearing aids, a denture cup will be supplied. Leave your suitcase in the car. After surgery it may be brought to your room. For patients admitted to the hospital, discharge time is determined by your treatment team.   Patients discharged the day of surgery will not be allowed to drive home.   Please read over the following fact sheets that you were given:   MRSA Information  __X__ Take these medicines the morning of surgery with A SIP OF WATER:  1. none  2.   3.   4.  5.  6.  ____ Fleet Enema (as directed)   __X__ Use CHG Soap/SAGE wipes as directed  __X__ Use inhalers on the day of surgery  ____ Stop metformin/Janumet/Farxiga 2 days prior to surgery    ____ Take 1/2 of usual insulin dose the night before surgery. No insulin the morning          of surgery.   ____ Stop Blood Thinners Coumadin/Plavix/Xarelto/Pleta/Pradaxa/Eliquis/Effient/Aspirin  on   Or contact your Surgeon, Cardiologist or Medical Doctor regarding  ability to stop your blood thinners  __X__ Stop Anti-inflammatories 7 days before surgery such as Advil, Ibuprofen, Motrin,  BC or Goodies Powder, Naprosyn, Naproxen, Aleve, Aspirin    __X__ Stop all herbal supplements, fish oil or vitamin E until after surgery.    ____ Bring C-Pap to the hospital.      2 HOURS PRIOR TO YOUR ARRIVAL DRINK THE ENSURE "CLEAR" PRE SURGERY DRINK

## 2020-07-12 NOTE — Addendum Note (Signed)
Addended by: Lorrene Reid on: 07/12/2020 11:30 AM   Modules accepted: Orders

## 2020-07-15 ENCOUNTER — Other Ambulatory Visit: Payer: Self-pay

## 2020-07-15 ENCOUNTER — Other Ambulatory Visit
Admission: RE | Admit: 2020-07-15 | Discharge: 2020-07-15 | Disposition: A | Discharge status: Home / Self Care | Payer: 59 | Source: Ambulatory Visit | Attending: Obstetrics and Gynecology | Admitting: Obstetrics and Gynecology

## 2020-07-15 DIAGNOSIS — Z20822 Contact with and (suspected) exposure to covid-19: Secondary | ICD-10-CM | POA: Diagnosis not present

## 2020-07-15 DIAGNOSIS — Z01812 Encounter for preprocedural laboratory examination: Secondary | ICD-10-CM | POA: Diagnosis not present

## 2020-07-15 LAB — SARS CORONAVIRUS 2 (TAT 6-24 HRS): SARS Coronavirus 2: NEGATIVE

## 2020-07-19 ENCOUNTER — Other Ambulatory Visit: Payer: Self-pay

## 2020-07-19 ENCOUNTER — Ambulatory Visit: Payer: 59 | Admitting: Certified Registered"

## 2020-07-19 ENCOUNTER — Ambulatory Visit
Admission: RE | Admit: 2020-07-19 | Discharge: 2020-07-19 | Disposition: A | Discharge status: Home / Self Care | Payer: 59 | Attending: Obstetrics and Gynecology | Admitting: Obstetrics and Gynecology

## 2020-07-19 ENCOUNTER — Encounter
Admission: RE | Disposition: A | Discharge status: Home / Self Care | Payer: Self-pay | Source: Home / Self Care | Attending: Obstetrics and Gynecology

## 2020-07-19 ENCOUNTER — Encounter: Payer: Self-pay | Admitting: Obstetrics and Gynecology

## 2020-07-19 DIAGNOSIS — F17210 Nicotine dependence, cigarettes, uncomplicated: Secondary | ICD-10-CM | POA: Diagnosis not present

## 2020-07-19 DIAGNOSIS — K660 Peritoneal adhesions (postprocedural) (postinfection): Secondary | ICD-10-CM

## 2020-07-19 DIAGNOSIS — R102 Pelvic and perineal pain: Secondary | ICD-10-CM | POA: Diagnosis not present

## 2020-07-19 DIAGNOSIS — G8929 Other chronic pain: Secondary | ICD-10-CM | POA: Diagnosis not present

## 2020-07-19 DIAGNOSIS — Z885 Allergy status to narcotic agent status: Secondary | ICD-10-CM | POA: Insufficient documentation

## 2020-07-19 DIAGNOSIS — Z793 Long term (current) use of hormonal contraceptives: Secondary | ICD-10-CM | POA: Diagnosis not present

## 2020-07-19 DIAGNOSIS — Z9889 Other specified postprocedural states: Secondary | ICD-10-CM

## 2020-07-19 DIAGNOSIS — Z79899 Other long term (current) drug therapy: Secondary | ICD-10-CM | POA: Insufficient documentation

## 2020-07-19 HISTORY — PX: LAPAROSCOPIC LYSIS OF ADHESIONS: SHX5905

## 2020-07-19 HISTORY — PX: LAPAROSCOPY: SHX197

## 2020-07-19 LAB — POCT PREGNANCY, URINE: Preg Test, Ur: NEGATIVE

## 2020-07-19 SURGERY — LAPAROSCOPY, DIAGNOSTIC
Anesthesia: General

## 2020-07-19 MED ORDER — DEXAMETHASONE SODIUM PHOSPHATE 10 MG/ML IJ SOLN
INTRAMUSCULAR | Status: DC | PRN
  Administered 2020-07-19: 10 mg via INTRAVENOUS

## 2020-07-19 MED ORDER — ORAL CARE MOUTH RINSE: 15.0000 mL | Freq: Once | OROMUCOSAL | Status: AC

## 2020-07-19 MED ORDER — PROPOFOL 10 MG/ML IV BOLUS
INTRAVENOUS | Status: AC
  Filled 2020-07-19: qty 20

## 2020-07-19 MED ORDER — PROPOFOL 10 MG/ML IV BOLUS
INTRAVENOUS | Status: DC | PRN
  Administered 2020-07-19: 150 mg via INTRAVENOUS
  Administered 2020-07-19: 50 mg via INTRAVENOUS

## 2020-07-19 MED ORDER — FENTANYL CITRATE (PF) 100 MCG/2ML IJ SOLN
INTRAMUSCULAR | Status: AC
  Administered 2020-07-19: 25 ug via INTRAVENOUS
  Filled 2020-07-19: qty 2

## 2020-07-19 MED ORDER — LACTATED RINGERS IV SOLN: INTRAVENOUS | Status: DC

## 2020-07-19 MED ORDER — FENTANYL CITRATE (PF) 100 MCG/2ML IJ SOLN
INTRAMUSCULAR | Status: DC | PRN
  Administered 2020-07-19: 100 ug via INTRAVENOUS
  Administered 2020-07-19 (×3): 50 ug via INTRAVENOUS

## 2020-07-19 MED ORDER — CHLORHEXIDINE GLUCONATE 0.12 % MT SOLN
OROMUCOSAL | Status: AC
  Filled 2020-07-19: qty 15

## 2020-07-19 MED ORDER — OXYCODONE-ACETAMINOPHEN 5-325 MG PO TABS
1.0000 | ORAL_TABLET | Freq: Once | ORAL | Status: AC
  Administered 2020-07-19: 1 via ORAL

## 2020-07-19 MED ORDER — OXYCODONE-ACETAMINOPHEN 5-325 MG PO TABS
1.0000 | ORAL_TABLET | ORAL | 0 refills | Status: DC | PRN
Start: 2020-07-19 — End: 2020-07-19

## 2020-07-19 MED ORDER — MIDAZOLAM HCL 2 MG/2ML IJ SOLN
INTRAMUSCULAR | Status: DC | PRN
  Administered 2020-07-19: 2 mg via INTRAVENOUS

## 2020-07-19 MED ORDER — ROCURONIUM BROMIDE 100 MG/10ML IV SOLN
INTRAVENOUS | Status: DC | PRN
  Administered 2020-07-19: 50 mg via INTRAVENOUS

## 2020-07-19 MED ORDER — FAMOTIDINE 20 MG PO TABS
ORAL_TABLET | ORAL | Status: AC
  Filled 2020-07-19: qty 1

## 2020-07-19 MED ORDER — KETOROLAC TROMETHAMINE 30 MG/ML IJ SOLN
INTRAMUSCULAR | Status: AC
  Administered 2020-07-19: 30 mg via INTRAVENOUS
  Filled 2020-07-19: qty 1

## 2020-07-19 MED ORDER — LIDOCAINE HCL (CARDIAC) PF 100 MG/5ML IV SOSY
PREFILLED_SYRINGE | INTRAVENOUS | Status: DC | PRN
  Administered 2020-07-19: 90 mg via INTRAVENOUS

## 2020-07-19 MED ORDER — ONDANSETRON HCL 4 MG/2ML IJ SOLN
INTRAMUSCULAR | Status: DC | PRN
  Administered 2020-07-19: 4 mg via INTRAVENOUS

## 2020-07-19 MED ORDER — SUGAMMADEX SODIUM 200 MG/2ML IV SOLN
INTRAVENOUS | Status: DC | PRN
  Administered 2020-07-19: 200 mg via INTRAVENOUS

## 2020-07-19 MED ORDER — LIDOCAINE HCL (PF) 2 % IJ SOLN
INTRAMUSCULAR | Status: AC
  Filled 2020-07-19: qty 5

## 2020-07-19 MED ORDER — LABETALOL HCL 5 MG/ML IV SOLN
INTRAVENOUS | Status: DC | PRN
  Administered 2020-07-19: 5 mg via INTRAVENOUS

## 2020-07-19 MED ORDER — FENTANYL CITRATE (PF) 100 MCG/2ML IJ SOLN
INTRAMUSCULAR | Status: AC
  Filled 2020-07-19: qty 2

## 2020-07-19 MED ORDER — BUPIVACAINE HCL 0.5 % IJ SOLN
INTRAMUSCULAR | Status: DC | PRN
  Administered 2020-07-19: 9 mL

## 2020-07-19 MED ORDER — FENTANYL CITRATE (PF) 100 MCG/2ML IJ SOLN
25.0000 ug | INTRAMUSCULAR | Status: DC | PRN
  Administered 2020-07-19 (×4): 25 ug via INTRAVENOUS

## 2020-07-19 MED ORDER — MIDAZOLAM HCL 2 MG/2ML IJ SOLN
INTRAMUSCULAR | Status: AC
  Filled 2020-07-19: qty 2

## 2020-07-19 MED ORDER — DEXMEDETOMIDINE (PRECEDEX) IN NS 20 MCG/5ML (4 MCG/ML) IV SYRINGE
PREFILLED_SYRINGE | INTRAVENOUS | Status: DC | PRN
  Administered 2020-07-19 (×2): 8 ug via INTRAVENOUS
  Administered 2020-07-19: 4 ug via INTRAVENOUS

## 2020-07-19 MED ORDER — ONDANSETRON HCL 4 MG/2ML IJ SOLN
INTRAMUSCULAR | Status: AC
  Filled 2020-07-19: qty 2

## 2020-07-19 MED ORDER — KETOROLAC TROMETHAMINE 30 MG/ML IJ SOLN: 30.0000 mg | Freq: Once | INTRAMUSCULAR | Status: AC

## 2020-07-19 MED ORDER — IBUPROFEN 600 MG PO TABS
600.0000 mg | ORAL_TABLET | Freq: Four times a day (QID) | ORAL | 0 refills | Status: DC | PRN
Start: 2020-07-19 — End: 2022-06-21

## 2020-07-19 MED ORDER — OXYCODONE-ACETAMINOPHEN 5-325 MG PO TABS
ORAL_TABLET | ORAL | Status: AC
  Filled 2020-07-19: qty 1

## 2020-07-19 MED ORDER — CHLORHEXIDINE GLUCONATE 0.12 % MT SOLN
15.0000 mL | Freq: Once | OROMUCOSAL | Status: AC
  Administered 2020-07-19: 15 mL via OROMUCOSAL

## 2020-07-19 MED ORDER — ROCURONIUM BROMIDE 10 MG/ML (PF) SYRINGE
PREFILLED_SYRINGE | INTRAVENOUS | Status: AC
  Filled 2020-07-19: qty 10

## 2020-07-19 MED ORDER — OXYCODONE-ACETAMINOPHEN 5-325 MG PO TABS
1.0000 | ORAL_TABLET | ORAL | 0 refills | Status: AC | PRN
Start: 2020-07-19 — End: 2021-07-19

## 2020-07-19 MED ORDER — POVIDONE-IODINE 10 % EX SWAB: 2.0000 "application " | Freq: Once | CUTANEOUS | Status: DC

## 2020-07-19 MED ORDER — FAMOTIDINE 20 MG PO TABS
20.0000 mg | ORAL_TABLET | Freq: Once | ORAL | Status: AC
  Administered 2020-07-19: 20 mg via ORAL

## 2020-07-19 MED ORDER — ONDANSETRON HCL 4 MG/2ML IJ SOLN: 4.0000 mg | Freq: Once | INTRAMUSCULAR | Status: DC | PRN

## 2020-07-19 MED ORDER — DEXAMETHASONE SODIUM PHOSPHATE 10 MG/ML IJ SOLN
INTRAMUSCULAR | Status: AC
  Filled 2020-07-19: qty 1

## 2020-07-19 SURGICAL SUPPLY — 35 items
ADH SKN CLS APL DERMABOND .7 (GAUZE/BANDAGES/DRESSINGS) ×1
APL PRP STRL LF DISP 70% ISPRP (MISCELLANEOUS) ×1
BLADE SURG SZ11 CARB STEEL (BLADE) ×2 IMPLANT
CATH ROBINSON RED A/P 16FR (CATHETERS) ×2 IMPLANT
CHLORAPREP W/TINT 26 (MISCELLANEOUS) ×2 IMPLANT
COVER WAND RF STERILE (DRAPES) ×2 IMPLANT
DERMABOND ADVANCED (GAUZE/BANDAGES/DRESSINGS) ×1
DERMABOND ADVANCED .7 DNX12 (GAUZE/BANDAGES/DRESSINGS) ×1 IMPLANT
DRSG TEGADERM 2-3/8X2-3/4 SM (GAUZE/BANDAGES/DRESSINGS) ×2 IMPLANT
GLOVE BIO SURGEON STRL SZ7 (GLOVE) ×8 IMPLANT
GLOVE INDICATOR 7.5 STRL GRN (GLOVE) ×8 IMPLANT
GOWN STRL REUS W/ TWL LRG LVL3 (GOWN DISPOSABLE) ×4 IMPLANT
GOWN STRL REUS W/TWL LRG LVL3 (GOWN DISPOSABLE) ×8
IRRIGATION STRYKERFLOW (MISCELLANEOUS) IMPLANT
IRRIGATOR STRYKERFLOW (MISCELLANEOUS)
IV LACTATED RINGERS 1000ML (IV SOLUTION) IMPLANT
KIT PINK PAD W/HEAD ARE REST (MISCELLANEOUS) ×2
KIT PINK PAD W/HEAD ARM REST (MISCELLANEOUS) ×1 IMPLANT
KIT TURNOVER CYSTO (KITS) ×2 IMPLANT
LABEL OR SOLS (LABEL) ×2 IMPLANT
MANIFOLD NEPTUNE II (INSTRUMENTS) IMPLANT
NS IRRIG 500ML POUR BTL (IV SOLUTION) ×2 IMPLANT
PACK GYN LAPAROSCOPIC (MISCELLANEOUS) ×2 IMPLANT
PAD OB MATERNITY 4.3X12.25 (PERSONAL CARE ITEMS) ×2 IMPLANT
PAD PREP 24X41 OB/GYN DISP (PERSONAL CARE ITEMS) ×2 IMPLANT
SCISSORS METZENBAUM CVD 33 (INSTRUMENTS) IMPLANT
SET TUBE SMOKE EVAC HIGH FLOW (TUBING) ×2 IMPLANT
SHEARS HARMONIC ACE PLUS 36CM (ENDOMECHANICALS) ×2 IMPLANT
SLEEVE ENDOPATH XCEL 5M (ENDOMECHANICALS) ×4 IMPLANT
SPONGE GAUZE 2X2 8PLY STRL LF (GAUZE/BANDAGES/DRESSINGS) ×2 IMPLANT
SUT MNCRL AB 4-0 PS2 18 (SUTURE) ×2 IMPLANT
SUT VIC AB 0 CT2 27 (SUTURE) ×2 IMPLANT
SYR 10ML LL (SYRINGE) ×2 IMPLANT
TROCAR ENDO BLADELESS 11MM (ENDOMECHANICALS) IMPLANT
TROCAR XCEL NON-BLD 5MMX100MML (ENDOMECHANICALS) ×2 IMPLANT

## 2020-07-19 NOTE — Discharge Instructions (Signed)
AMBULATORY SURGERY  °DISCHARGE INSTRUCTIONS ° ° °1) The drugs that you were given will stay in your system until tomorrow so for the next 24 hours you should not: ° °A) Drive an automobile °B) Make any legal decisions °C) Drink any alcoholic beverage ° ° °2) You may resume regular meals tomorrow.  Today it is better to start with liquids and gradually work up to solid foods. ° °You may eat anything you prefer, but it is better to start with liquids, then soup and crackers, and gradually work up to solid foods. ° ° °3) Please notify your doctor immediately if you have any unusual bleeding, trouble breathing, redness and pain at the surgery site, drainage, fever, or pain not relieved by medication. ° ° ° °4) Additional Instructions: ° ° ° ° ° ° ° °Please contact your physician with any problems or Same Day Surgery at [phone], Monday through Friday 6 am to 4 pm, or [place] [facility] [place] Main number at [phone]. °

## 2020-07-19 NOTE — Interval H&P Note (Signed)
History and Physical Interval Note:  07/19/2020 12:03 PM  Kaitlyn Hall  has presented today for surgery, with the diagnosis of Chronic pelvic pain Dysmenorrhea.  The various methods of treatment have been discussed with the patient and family. After consideration of risks, benefits and other options for treatment, the patient has consented to  Procedure(s): LAPAROSCOPY DIAGNOSTIC (N/A) as a surgical intervention.  The patient's history has been reviewed, patient examined, no change in status, stable for surgery.  I have reviewed the patient's chart and labs.  Questions were answered to the patient's satisfaction.     Vena Austria

## 2020-07-19 NOTE — Anesthesia Procedure Notes (Signed)
Procedure Name: Intubation Date/Time: 07/19/2020 12:58 PM Performed by: Ambrose Finland, RN Pre-anesthesia Checklist: Patient identified, Emergency Drugs available, Suction available and Patient being monitored Patient Re-evaluated:Patient Re-evaluated prior to induction Oxygen Delivery Method: Circle system utilized Preoxygenation: Pre-oxygenation with 100% oxygen Induction Type: IV induction Ventilation: Mask ventilation without difficulty Laryngoscope Size: McGraph and 3 Grade View: Grade I Tube type: Oral Tube size: 7.0 mm Number of attempts: 1 Airway Equipment and Method: Stylet and Video-laryngoscopy Placement Confirmation: ETT inserted through vocal cords under direct vision,  positive ETCO2 and breath sounds checked- equal and bilateral Secured at: 22 cm Tube secured with: Tape Dental Injury: Teeth and Oropharynx as per pre-operative assessment

## 2020-07-19 NOTE — Transfer of Care (Signed)
Immediate Anesthesia Transfer of Care Note  Patient: Kaitlyn Hall  Procedure(s) Performed: LAPAROSCOPY DIAGNOSTIC (N/A ) LAPAROSCOPIC LYSIS OF ADHESIONS (N/A )  Patient Location: PACU  Anesthesia Type:General  Level of Consciousness: awake, alert  and oriented  Airway & Oxygen Therapy: Patient Spontanous Breathing and Patient connected to face mask oxygen  Post-op Assessment: Report given to RN and Post -op Vital signs reviewed and stable  Post vital signs: Reviewed and stable  Last Vitals:  Vitals Value Taken Time  BP 122/58 07/19/20 1422  Temp 36.4 C 07/19/20 1422  Pulse 88 07/19/20 1422  Resp 18 07/19/20 1430  SpO2 100 % 07/19/20 1422  Vitals shown include unvalidated device data.  Last Pain:  Vitals:   07/19/20 1422  TempSrc:   PainSc: Asleep         Complications: No complications documented.

## 2020-07-19 NOTE — Anesthesia Preprocedure Evaluation (Signed)
Anesthesia Evaluation  Patient identified by MRN, date of birth, ID band Patient awake    Reviewed: Allergy & Precautions, H&P , NPO status , Patient's Chart, lab work & pertinent test results, reviewed documented beta blocker date and time   Airway Mallampati: II  TM Distance: >3 FB Neck ROM: full    Dental  (+) Teeth Intact   Pulmonary neg pulmonary ROS, Current Smoker and Patient abstained from smoking.,    Pulmonary exam normal        Cardiovascular Exercise Tolerance: Good negative cardio ROS Normal cardiovascular exam Rhythm:regular Rate:Normal     Neuro/Psych  Headaches, negative psych ROS   GI/Hepatic Neg liver ROS, GERD  Medicated,  Endo/Other  Morbid obesity  Renal/GU negative Renal ROS  negative genitourinary   Musculoskeletal   Abdominal   Peds  Hematology  (+) Blood dyscrasia, anemia ,   Anesthesia Other Findings Past Medical History: No date: Anemia No date: Chronic bronchitis (HCC) No date: GERD (gastroesophageal reflux disease) No date: Migraine Past Surgical History: 2010: CESAREAN SECTION 2010: TUBAL LIGATION BMI    Body Mass Index: 40.01 kg/m     Reproductive/Obstetrics negative OB ROS                             Anesthesia Physical Anesthesia Plan  ASA: III  Anesthesia Plan: General ETT   Post-op Pain Management:    Induction:   PONV Risk Score and Plan:   Airway Management Planned:   Additional Equipment:   Intra-op Plan:   Post-operative Plan:   Informed Consent: I have reviewed the patients History and Physical, chart, labs and discussed the procedure including the risks, benefits and alternatives for the proposed anesthesia with the patient or authorized representative who has indicated his/her understanding and acceptance.     Dental Advisory Given  Plan Discussed with: CRNA  Anesthesia Plan Comments:         Anesthesia Quick  Evaluation

## 2020-07-19 NOTE — Op Note (Signed)
Preoperative Diagnosis: 1) 39 y.o.   Postoperative Diagnosis: 1) 39 y.o.   Operation Performed: Diagnostic laparoscopy, lysis of adhesions >88minutes  Indication: 39 y.o. [id number]  with chronic pelvic pain  Surgeon: Vena Austria, MD  Anesthesia: Choice  Preoperative Antibiotics: none  Estimated Blood Loss: 10 mL  IV Fluids: 1L  Urine Output:: 30mL  Drains or Tubes: none  Implants: none  Specimens Removed: none  Complications: none  Intraoperative Findings: Normal tubes status post prior partial salpingectomy, ovaries, and uterus.  Appendix normal.  Normal liver edge with a small hemangioma vs focal nodular hyperplasia noted on the surface of the left lobe.  No evidence of endometriosis.  Patient Condition: stable  Procedure in Detail:  Patient was taken to the operating room where she was administered general anesthesia.  She was positioned in the dorsal lithotomy position utilizing Allen stirups, prepped and draped in the usual sterile fashion.  Prior to proceeding with procedure a time out was performed.  Attention was turned to the patient's pelvis.  A red rubber catheter was used to empty the patient's bladder.  An operative speculum was placed to allow visualization of the cervix.  The anterior lip of the cervix was grasped with a single tooth tenaculum, and a Hulka tenaculum was placed to allow manipulation of the uterus.  The operative speculum and single tooth tenaculum were then removed.  Attention was turned to the patient's abdomen.  The umbilicus was infiltrated with 1% Sensorcaine, before making a stab incision using an 11 blade scalpel.  A 75mm Excel trocar was then used to gain direct entry into the peritoneal cavity utilizing the camera to visualize progress of the trocar during placement.  Once peritoneal entry had been achieved, insufflation was started and pneumoperitoneum established at a pressure of . There were significant omental adhesions which  limited views of the lower pelvis.  Two additional 77mm excel trocars were placed under direct visualiztion in the left upper and lower quadrant.  A 33mm harmonic scalpel was used to take down the omental adhesions which allowed acces to the pelvis.  General inspection of the abdomen revealed the above noted findings.   Pneumoperitoneum was evacuated.  The trocars were removed.  All trocar sites were then dressed with surgical skin glue.  The Hulka tenaculum was removed.  Sponge needle and instrument counts were correct time two.  The patient tolerated the procedure well and was taken to the recovery room in stable condition.

## 2020-07-20 ENCOUNTER — Encounter: Payer: Self-pay | Admitting: Obstetrics and Gynecology

## 2020-07-20 NOTE — Anesthesia Postprocedure Evaluation (Signed)
Anesthesia Post Note  Patient: Elisandra Deshmukh  Procedure(s) Performed: LAPAROSCOPY DIAGNOSTIC (N/A ) LAPAROSCOPIC LYSIS OF ADHESIONS (N/A )  Patient location during evaluation: PACU Anesthesia Type: General Level of consciousness: awake and alert Pain management: pain level controlled Vital Signs Assessment: post-procedure vital signs reviewed and stable Respiratory status: spontaneous breathing, nonlabored ventilation, respiratory function stable and patient connected to nasal cannula oxygen Cardiovascular status: blood pressure returned to baseline and stable Postop Assessment: no apparent nausea or vomiting Anesthetic complications: no   No complications documented.   Last Vitals:  Vitals:   07/19/20 1507 07/19/20 1521  BP: 117/80 109/69  Pulse: 72 74  Resp: 16 16  Temp: (!) 36.3 C 36.7 C  SpO2: 99% 100%    Last Pain:  Vitals:   07/19/20 1521  TempSrc: Temporal  PainSc: 3                  Yevette Edwards

## 2020-07-29 ENCOUNTER — Other Ambulatory Visit: Payer: Self-pay

## 2020-07-29 ENCOUNTER — Encounter: Payer: Self-pay | Admitting: Obstetrics and Gynecology

## 2020-07-29 ENCOUNTER — Ambulatory Visit (INDEPENDENT_AMBULATORY_CARE_PROVIDER_SITE_OTHER): Payer: 59 | Admitting: Obstetrics and Gynecology

## 2020-07-29 VITALS — BP 126/78 | Ht 69.0 in | Wt 269.0 lb

## 2020-07-29 DIAGNOSIS — Z4889 Encounter for other specified surgical aftercare: Secondary | ICD-10-CM

## 2020-07-29 NOTE — Progress Notes (Signed)
      Postoperative Follow-up Patient presents post op from laparoscopic lysis of adhesions 1weeks ago for pelvic pain.  Subjective: Patient reports some improvement in her preop symptoms. Eating a regular diet without difficulty. Pain is controlled with current analgesics. Medications being used: prescription NSAID's including ibuprofen (Motrin) and narcotic analgesics including oxycodone/acetaminophen (Percocet, Tylox).  Activity: normal activities of daily living.  Objective: Blood pressure 126/78, height 5\' 9"  (1.753 m), weight 269 lb (122 kg).  General: NAD Pulmonary: no increased work of breathing Abdomen: soft, non-tender, non-distended, incision(s) D/C/I Extremities: no edema Neurologic: normal gait    Admission on 07/19/2020, Discharged on 07/19/2020  Component Date Value Ref Range Status  . Preg Test, Ur 07/19/2020 NEGATIVE  NEGATIVE Final   Comment:        THE SENSITIVITY OF THIS METHODOLOGY IS >24 mIU/mL     Assessment: 39 y.o. s/p laparoscopic lysis of adhesions stable  Plan: Patient has done well after surgery with no apparent complications.  I have discussed the post-operative course to date, and the expected progress moving forward.  The patient understands what complications to be concerned about.  I will see the patient in routine follow up, or sooner if needed.    Activity plan: No restriction.   Continue norethindrone for now.  If continued central pelvic pain discussed hysterectomy may or may not yield any improvement  20, MD, Vena Austria [facility], [facility] 07/29/2020, 9:34 AM

## 2020-07-29 NOTE — Progress Notes (Signed)
RM 4 

## 2020-09-05 ENCOUNTER — Ambulatory Visit (INDEPENDENT_AMBULATORY_CARE_PROVIDER_SITE_OTHER): Payer: 59 | Admitting: Obstetrics and Gynecology

## 2020-09-05 ENCOUNTER — Other Ambulatory Visit: Payer: Self-pay

## 2020-09-05 ENCOUNTER — Encounter: Payer: Self-pay | Admitting: Obstetrics and Gynecology

## 2020-09-05 VITALS — BP 118/72 | Ht 69.0 in | Wt 276.0 lb

## 2020-09-05 DIAGNOSIS — Z4889 Encounter for other specified surgical aftercare: Secondary | ICD-10-CM

## 2020-09-05 NOTE — Progress Notes (Signed)
      Postoperative Follow-up Patient presents post op from diagnostic laparoscopy and lysis of adhesions 6weeks ago for pelvic pain.  Subjective: Patient reports marked improvement in her preop symptoms. Eating a regular diet without difficulty. The patient is not having any pain.  Activity: normal activities of daily living.  Objective: Blood pressure 118/72, height 5\' 9"  (1.753 m), weight 276 lb (125.2 kg).  General: NAD Pulmonary: no increased work of breathing Abdomen: soft, non-tender, non-distended, incision(s) D/C/I Extremities: no edema Neurologic: normal gait    Admission on 07/19/2020, Discharged on 07/19/2020  Component Date Value Ref Range Status  . Preg Test, Ur 07/19/2020 NEGATIVE  NEGATIVE Final   Comment:        THE SENSITIVITY OF THIS METHODOLOGY IS >24 mIU/mL     Assessment: 40 y.o. s/p diagnostic laparoscopy and lysis of adhesions stable  Plan: Patient has done well after surgery with no apparent complications.  I have discussed the post-operative course to date, and the expected progress moving forward.  The patient understands what complications to be concerned about.  I will see the patient in routine follow up, or sooner if needed.    Activity plan: No restriction.   20, MD, Vena Austria [facility], [facility] 09/05/2020, 8:26 AM

## 2020-09-14 ENCOUNTER — Other Ambulatory Visit: Payer: 59

## 2020-09-14 DIAGNOSIS — Z20822 Contact with and (suspected) exposure to covid-19: Secondary | ICD-10-CM

## 2020-09-16 LAB — NOVEL CORONAVIRUS, NAA: SARS-CoV-2, NAA: DETECTED — AB

## 2020-09-16 LAB — SARS-COV-2, NAA 2 DAY TAT

## 2020-09-18 ENCOUNTER — Telehealth: Payer: Self-pay | Admitting: Unknown Physician Specialty

## 2020-09-18 NOTE — Telephone Encounter (Signed)
Called to discuss with patient about COVID-19 symptoms and the use of one of the available treatments for those with mild to moderate Covid symptoms and at a high risk of hospitalization.  Pt appears to qualify for outpatient treatment due to co-morbid conditions and/or a member of an at-risk group in accordance with the FDA Emergency Use Authorization.    Symptom onset: 1/17 Vaccinated: yes Booster? no Immunocompromised? No  Unfortunately she will be out of the mab range tomorrow when mab is next offered.  She is improving and symptomatic relief discussed and to go to ER for SOB or chest pain    Kaitlyn Hall

## 2020-10-13 ENCOUNTER — Other Ambulatory Visit: Payer: Self-pay | Admitting: Obstetrics and Gynecology

## 2020-10-13 MED ORDER — FLUCONAZOLE 150 MG PO TABS: 150.0000 mg | ORAL_TABLET | Freq: Once | ORAL | 0 refills | Status: AC

## 2020-12-14 ENCOUNTER — Encounter: Payer: Self-pay | Admitting: Emergency Medicine

## 2020-12-14 ENCOUNTER — Other Ambulatory Visit: Payer: Self-pay

## 2020-12-14 ENCOUNTER — Emergency Department: Payer: Self-pay

## 2020-12-14 ENCOUNTER — Emergency Department
Admission: EM | Admit: 2020-12-14 | Discharge: 2020-12-14 | Disposition: A | Discharge status: Home / Self Care | Payer: Self-pay | Attending: Emergency Medicine | Admitting: Emergency Medicine

## 2020-12-14 DIAGNOSIS — R1010 Upper abdominal pain, unspecified: Secondary | ICD-10-CM

## 2020-12-14 DIAGNOSIS — Z87891 Personal history of nicotine dependence: Secondary | ICD-10-CM | POA: Insufficient documentation

## 2020-12-14 DIAGNOSIS — R1013 Epigastric pain: Secondary | ICD-10-CM | POA: Insufficient documentation

## 2020-12-14 DIAGNOSIS — K279 Peptic ulcer, site unspecified, unspecified as acute or chronic, without hemorrhage or perforation: Secondary | ICD-10-CM | POA: Insufficient documentation

## 2020-12-14 DIAGNOSIS — R519 Headache, unspecified: Secondary | ICD-10-CM | POA: Insufficient documentation

## 2020-12-14 LAB — URINALYSIS, COMPLETE (UACMP) WITH MICROSCOPIC
Bilirubin Urine: NEGATIVE
Glucose, UA: NEGATIVE mg/dL
Ketones, ur: NEGATIVE mg/dL
Leukocytes,Ua: NEGATIVE
Nitrite: NEGATIVE
Protein, ur: NEGATIVE mg/dL
Specific Gravity, Urine: 1.023 (ref 1.005–1.030)
pH: 5 (ref 5.0–8.0)

## 2020-12-14 LAB — COMPREHENSIVE METABOLIC PANEL
ALT: 20 U/L (ref 0–44)
AST: 26 U/L (ref 15–41)
Albumin: 4.5 g/dL (ref 3.5–5.0)
Alkaline Phosphatase: 57 U/L (ref 38–126)
Anion gap: 9 (ref 5–15)
BUN: 15 mg/dL (ref 6–20)
CO2: 25 mmol/L (ref 22–32)
Calcium: 9.1 mg/dL (ref 8.9–10.3)
Chloride: 103 mmol/L (ref 98–111)
Creatinine, Ser: 0.97 mg/dL (ref 0.44–1.00)
GFR, Estimated: >60 mL/min (ref >60)
Glucose, Bld: 93 mg/dL (ref 70–99)
Potassium: 3.8 mmol/L (ref 3.5–5.1)
Sodium: 137 mmol/L (ref 135–145)
Total Bilirubin: 0.7 mg/dL (ref 0.3–1.2)
Total Protein: 7.8 g/dL (ref 6.5–8.1)

## 2020-12-14 LAB — LIPASE, BLOOD: Lipase: 34 U/L (ref 11–51)

## 2020-12-14 LAB — CBC
HCT: 39.9 % (ref 36.0–46.0)
Hemoglobin: 13.1 g/dL (ref 12.0–15.0)
MCH: 27 pg (ref 26.0–34.0)
MCHC: 32.8 g/dL (ref 30.0–36.0)
MCV: 82.3 fL (ref 80.0–100.0)
Platelets: 337 10*3/uL (ref 150–400)
RBC: 4.85 MIL/uL (ref 3.87–5.11)
RDW: 14.2 % (ref 11.5–15.5)
WBC: 8.2 10*3/uL (ref 4.0–10.5)
nRBC: 0 % (ref 0.0–0.2)

## 2020-12-14 LAB — PREGNANCY, URINE: Preg Test, Ur: NEGATIVE

## 2020-12-14 MED ORDER — SODIUM CHLORIDE 0.9 % IV BOLUS
1000.0000 mL | Freq: Once | INTRAVENOUS | Status: AC
  Administered 2020-12-14: 1000 mL via INTRAVENOUS

## 2020-12-14 MED ORDER — LIDOCAINE VISCOUS HCL 2 % MT SOLN
15.0000 mL | Freq: Once | OROMUCOSAL | Status: AC
  Administered 2020-12-14: 15 mL via ORAL
  Filled 2020-12-14: qty 15

## 2020-12-14 MED ORDER — FENTANYL CITRATE (PF) 100 MCG/2ML IJ SOLN
50.0000 ug | Freq: Once | INTRAMUSCULAR | Status: AC
  Administered 2020-12-14: 50 ug via INTRAVENOUS
  Filled 2020-12-14: qty 2

## 2020-12-14 MED ORDER — SUCRALFATE 1 G PO TABS: 1.0000 g | ORAL_TABLET | Freq: Four times a day (QID) | ORAL | 1 refills | Status: AC

## 2020-12-14 MED ORDER — IOHEXOL 300 MG/ML  SOLN
125.0000 mL | Freq: Once | INTRAMUSCULAR | Status: AC | PRN
  Administered 2020-12-14: 125 mL via INTRAVENOUS

## 2020-12-14 MED ORDER — TRAMADOL HCL 50 MG PO TABS
50.0000 mg | ORAL_TABLET | Freq: Four times a day (QID) | ORAL | 0 refills | Status: DC | PRN
Start: 2020-12-14 — End: 2021-09-15

## 2020-12-14 MED ORDER — ONDANSETRON HCL 4 MG/2ML IJ SOLN
4.0000 mg | Freq: Once | INTRAMUSCULAR | Status: AC
  Administered 2020-12-14: 4 mg via INTRAVENOUS
  Filled 2020-12-14: qty 2

## 2020-12-14 MED ORDER — ALUM & MAG HYDROXIDE-SIMETH 200-200-20 MG/5ML PO SUSP
30.0000 mL | Freq: Once | ORAL | Status: AC
  Administered 2020-12-14: 30 mL via ORAL
  Filled 2020-12-14: qty 30

## 2020-12-14 MED ORDER — PANTOPRAZOLE SODIUM 40 MG PO TBEC: 40.0000 mg | DELAYED_RELEASE_TABLET | Freq: Every day | ORAL | 1 refills | Status: AC

## 2020-12-14 MED ORDER — FAMOTIDINE IN NACL 20-0.9 MG/50ML-% IV SOLN
20.0000 mg | Freq: Once | INTRAVENOUS | Status: AC
  Administered 2020-12-14: 20 mg via INTRAVENOUS
  Filled 2020-12-14: qty 50

## 2020-12-14 NOTE — Discharge Instructions (Addendum)
Follow-up with your regular doctor.  Please call for an appointment. Follow-up with Dr. Mia Creek with [facility] gastroenterology.  Please call for appointment Take Protonix as prescribed, Carafate if worsening abdominal pain Drink plenty of water Tylenol for pain

## 2020-12-14 NOTE — ED Provider Notes (Signed)
[facility] Emergency Department Provider Note  ____________________________________________   Event Date/Time   First MD Initiated Contact with Patient 12/14/20 1644     (approximate)  I have reviewed the triage vital signs and the nursing notes.   HISTORY  Chief Complaint Abdominal Pain    HPI Kaitlyn Hall is a 40 y.o. female presents emergency department complaint epigastric pain started 3 days ago.  Patient has a history of ulcers and has been using her omeprazole without any relief.  She states she also took Tums.  She denies any fever or chills.  However patient does have diarrhea which is watery.  She denies vomiting.    Past Medical History:  Diagnosis Date  . Anemia   . Chronic bronchitis (HCC)   . GERD (gastroesophageal reflux disease)   . Migraine     Patient Active Problem List   Diagnosis Date Noted  . Pelvic pain   . Adhesion of omentum     Past Surgical History:  Procedure Laterality Date  . CESAREAN SECTION  2010  . LAPAROSCOPIC LYSIS OF ADHESIONS N/A 07/19/2020   Procedure: LAPAROSCOPIC LYSIS OF ADHESIONS;  Surgeon: Vena Austria, MD;  Location: [facility] ORS;  Service: Gynecology;  Laterality: N/A;  . LAPAROSCOPY N/A 07/19/2020   Procedure: LAPAROSCOPY DIAGNOSTIC;  Surgeon: Vena Austria, MD;  Location: [facility] ORS;  Service: Gynecology;  Laterality: N/A;  . TUBAL LIGATION  2010    Prior to Admission medications   Medication Sig Start Date End Date Taking? Authorizing Provider  pantoprazole (PROTONIX) 40 MG tablet Take 1 tablet (40 mg total) by mouth daily. 12/14/20 12/14/21 Yes Gibril Mastro, Roselyn Bering, PA-C  sucralfate (CARAFATE) 1 g tablet Take 1 tablet (1 g total) by mouth 4 (four) times daily. 12/14/20 12/14/21 Yes Deneise Getty, Roselyn Bering, PA-C  traMADol (ULTRAM) 50 MG tablet Take 1 tablet (50 mg total) by mouth every 6 (six) hours as needed. 12/14/20  Yes Madix Blowe, Roselyn Bering, PA-C  acetaminophen (TYLENOL) 500 MG tablet Take 1,000 mg by  mouth every 6 (six) hours as needed for moderate pain.    [provider]  albuterol (VENTOLIN HFA) 108 (90 Base) MCG/ACT inhaler Inhale 2 puffs into the lungs every 6 (six) hours as needed for wheezing or shortness of breath. 05/07/20   Lucy Chris, PA  Elagolix Sodium (ORILISSA) 150 MG TABS Take 1 tablet by mouth daily. Patient not taking: Reported on 07/07/2020 05/03/20   Vena Austria, MD  ibuprofen (ADVIL) 600 MG tablet Take 1 tablet (600 mg total) by mouth every 6 (six) hours as needed for moderate pain or cramping. 07/19/20   Vena Austria, MD  norethindrone (AYGESTIN) 5 MG tablet TAKE 1 TABLET BY MOUTH EVERY DAY Patient taking differently: Take 5 mg by mouth daily. 04/25/20   Vena Austria, MD  oxyCODONE-acetaminophen (PERCOCET) 5-325 MG tablet Take 1 tablet by mouth every 4 (four) hours as needed for severe pain. Patient not taking: Reported on 09/05/2020 07/19/20 07/19/21  Vena Austria, MD  SUMAtriptan (IMITREX) 50 MG tablet Take 1 tablet (50 mg total) by mouth once as needed for migraine. May repeat in 2 hours if headache persists or recurs. 03/24/16 07/12/20  Emily Filbert, MD    Allergies Codeine  History reviewed. No pertinent family history.  Social History Social History   Tobacco Use  . Smoking status: Former Smoker    Packs/day: 1.00    Types: Cigarettes    Quit date: 08/27/2020    Years since quitting: 0.2  .  Smokeless tobacco: Never Used  Vaping Use  . Vaping Use: Never used  Substance Use Topics  . Alcohol use: Yes    Comment: occasionally  . Drug use: No    Review of Systems  Constitutional: No fever/chills Eyes: No visual changes. ENT: No sore throat. Respiratory: Denies cough Cardiovascular: Denies chest pain Gastrointestinal: Positive abdominal pain Genitourinary: Negative for dysuria. Musculoskeletal: Negative for back pain. Skin: Negative for rash. Psychiatric: no mood changes,      ____________________________________________   PHYSICAL EXAM:  VITAL SIGNS: ED Triage Vitals  Enc Vitals Group     BP 12/14/20 1644 (!) 163/96     Pulse Rate 12/14/20 1644 76     Resp 12/14/20 1644 16     Temp 12/14/20 1644 97.9 F (36.6 C)     Temp Source 12/14/20 1644 Oral     SpO2 12/14/20 1644 100 %     Weight 12/14/20 1642 285 lb (129.3 kg)     Height 12/14/20 1642 5\' 9"  (1.753 m)     Head Circumference --      Peak Flow --      Pain Score 12/14/20 1642 10     Pain Loc --      Pain Edu? --      Excl. in GC? --     Constitutional: Alert and oriented. Well appearing and in no acute distress. Eyes: Conjunctivae are normal.  Head: Atraumatic. Nose: No congestion/rhinnorhea. Mouth/Throat: Mucous membranes are moist.   Neck:  supple no lymphadenopathy noted Cardiovascular: Normal rate, regular rhythm. Heart sounds are normal Respiratory: Normal respiratory effort.  No retractions, lungs c t a  Abd: soft tender in the epigastric.  bs normal all 4 quad GU: deferred Musculoskeletal: FROM all extremities, warm and well perfused Neurologic:  Normal speech and language.  Skin:  Skin is warm, dry and intact. No rash noted. Psychiatric: Mood and affect are normal. Speech and behavior are normal.  ____________________________________________   LABS (all labs ordered are listed, but only abnormal results are displayed)  Labs Reviewed  URINALYSIS, COMPLETE (UACMP) WITH MICROSCOPIC - Abnormal; Notable for the following components:      Result Value   Color, Urine YELLOW (*)    APPearance HAZY (*)    Hgb urine dipstick MODERATE (*)    Bacteria, UA RARE (*)    All other components within normal limits  LIPASE, BLOOD  COMPREHENSIVE METABOLIC PANEL  CBC  PREGNANCY, URINE   ____________________________________________   ____________________________________________  RADIOLOGY  CT abdomen/pelvis with IV  contrast  ____________________________________________   PROCEDURES  Procedure(s) performed: EKG  Procedures    ____________________________________________   INITIAL IMPRESSION / ASSESSMENT AND PLAN / ED COURSE  Pertinent labs & imaging results that were available during my care of the patient were reviewed by me and considered in my medical decision making (see chart for details).   Patient is 40 year old female presents with abdominal pain.  See HPI.  Patient is stable at this time.  DDx: Acute pancreatitis, peptic ulcer disease, acute gastroenteritis, colitis, viral illness  Patient was given Pepcid IV 20 mg, fentanyl for pain, Zofran for nausea and saline 1 L.  Labs ordered, CT ordered  EKG shows NSR, see physician read  Labs are reassuring, CBC, comprehensive metabolic panel, lipase and urinalysis are all negative, pregnancy test is negative  CT abdomen pelvis with IV contrast reviewed by me, confirmed by radiology to have inflammation at lower stomach typical of pud  Explained the findings to the  patient. She is to f/u with GI, f/u with her regular doctor, take protonix daily, carafate as needed, tramadol for pain as needed.  Tylenol for pain as needed.  She was discharged in stable condition.    Kaitlyn Hall was evaluated in Emergency Department on 12/14/2020 for the symptoms described in the history of present illness. She was evaluated in the context of the global COVID-19 pandemic, which necessitated consideration that the patient might be at risk for infection with the SARS-CoV-2 virus that causes COVID-19. Institutional protocols and algorithms that pertain to the evaluation of patients at risk for COVID-19 are in a state of rapid change based on information released by regulatory bodies including the CDC and federal and state organizations. These policies and algorithms were followed during the patient's care in the ED.    As part of my medical decision making,  I reviewed the following data within the electronic MEDICAL RECORD NUMBER Nursing notes reviewed and incorporated, Labs reviewed , EKG interpreted NSR, Old chart reviewed, Radiograph reviewed , Evaluated by EM attending , Notes from prior ED visits and [place] Controlled Substance Database  ____________________________________________   FINAL CLINICAL IMPRESSION(S) / ED DIAGNOSES  Final diagnoses:  PUD (peptic ulcer disease)  Pain of upper abdomen  Bad headache      NEW MEDICATIONS STARTED DURING THIS VISIT:  New Prescriptions   PANTOPRAZOLE (PROTONIX) 40 MG TABLET    Take 1 tablet (40 mg total) by mouth daily.   SUCRALFATE (CARAFATE) 1 G TABLET    Take 1 tablet (1 g total) by mouth 4 (four) times daily.   TRAMADOL (ULTRAM) 50 MG TABLET    Take 1 tablet (50 mg total) by mouth every 6 (six) hours as needed.     Note:  This document was prepared using Dragon voice recognition software and may include unintentional dictation errors.    Faythe Ghee, PA-C 12/14/20 Nathanial Rancher, MD 12/14/20 2018

## 2020-12-14 NOTE — ED Triage Notes (Signed)
Pt comes into the ED c/o epigastric pain that started 3 days ago.  Pt denies any N/V/D.  Pt states they had diagnosed her with ulcers about a year ago.  Pt does take omeprazole with no relief.  Pt denies any blood in stools.  Pt ambulatory to triage at this time and in NAD with even and unlabored respirations.

## 2020-12-19 ENCOUNTER — Other Ambulatory Visit: Payer: Self-pay | Admitting: Obstetrics and Gynecology

## 2021-06-29 IMAGING — DX DG CHEST 1V PORT
1 series · 1 of 1 positions shown · non-contrast
Comparison: 06/11/2019

CLINICAL DATA: COVID positive on [REDACTED] with symptoms starting
last [REDACTED]. Weakness, cough, and vomiting.

EXAM:
PORTABLE CHEST 1 VIEW

[chest ap]
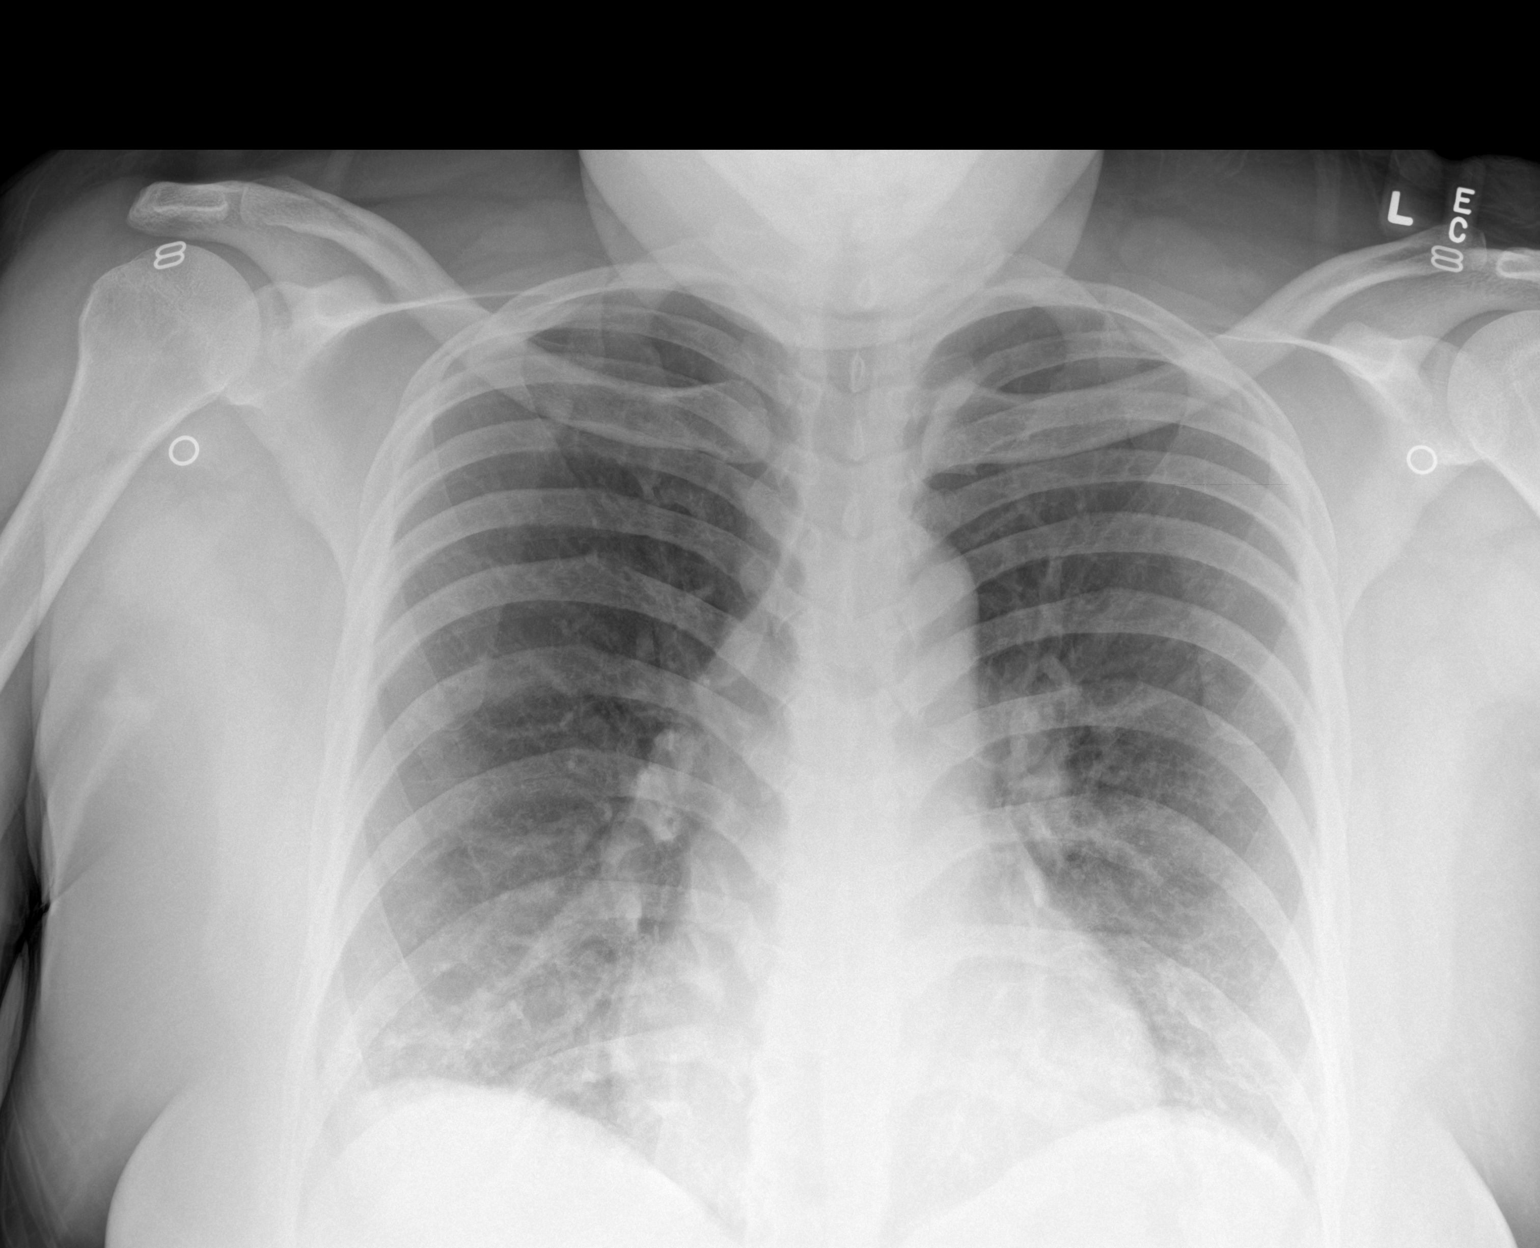

[1 of 1 positions shown; findings below may reference images not displayed]

FINDINGS: Shallow inspiration. Heart size and pulmonary vascularity are
normal. Increased density in the lung bases is likely due to soft
tissue attenuation. Lungs appear clear and expanded. No pleural
effusions. No pneumothorax. Mediastinal contours appear intact.
IMPRESSION: No active disease.

## 2021-08-09 ENCOUNTER — Encounter: Payer: Self-pay | Admitting: Obstetrics and Gynecology

## 2021-08-09 NOTE — Telephone Encounter (Signed)
Called and left voicemail for patient to call back to be scheduled. 

## 2021-08-15 ENCOUNTER — Ambulatory Visit: Payer: 59 | Admitting: Obstetrics

## 2021-09-15 ENCOUNTER — Ambulatory Visit: Payer: BC Managed Care – PPO | Admitting: Licensed Practical Nurse

## 2021-09-15 ENCOUNTER — Encounter: Payer: Self-pay | Admitting: Licensed Practical Nurse

## 2021-09-15 ENCOUNTER — Other Ambulatory Visit: Payer: Self-pay

## 2021-09-15 VITALS — BP 130/70 | Ht 69.0 in | Wt 308.0 lb

## 2021-09-15 DIAGNOSIS — N898 Other specified noninflammatory disorders of vagina: Secondary | ICD-10-CM | POA: Diagnosis not present

## 2021-09-15 DIAGNOSIS — Z113 Encounter for screening for infections with a predominantly sexual mode of transmission: Secondary | ICD-10-CM

## 2021-09-15 DIAGNOSIS — L989 Disorder of the skin and subcutaneous tissue, unspecified: Secondary | ICD-10-CM | POA: Diagnosis not present

## 2021-09-15 NOTE — Progress Notes (Signed)
Gynecology STD Evaluation   Chief Complaint:  Chief Complaint  Patient presents with   Vaginal Itching    Irritation, no discharge or odor x 1 week    History of Present Illness: Patient is a 41 y.o. [id number] presents for STD testing.  The patient has not noted intermenstrual spotting,  has not experienced postcoital bleeding, and does not report increased vaginal discharge.    Nathalee has had external itching x 7 days, it now has turned to a burning sensation. She has a new partner and admits her "body goes haywire" whenever she has a new partner. She has had a yeast infection in the past, at that time her symptoms were more internal. 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  She does get pretty sweaty at work. She did shave per pubic area around the new year. She has used Vaseline and vagisil with some relief. For the last day or so she has had burning with urination.  Denies any hx of HSV.  The patient is sexually active . She currently uses tubal ligation for contraception. the patient also "usually" relies on condoms to prevent the spread of sexually transmitted infections.     PMHx: She  has a past medical history of Anemia, Chronic bronchitis ([place]), GERD (gastroesophageal reflux disease), and Migraine. Also,  has a past surgical history that includes Tubal ligation (2010); Cesarean section (2010); laparoscopy (N/A, 07/19/2020); and Laparoscopic lysis of adhesions (N/A, 07/19/2020)., family history is not on file.,  reports that she quit smoking about 12 months ago. Her smoking use included cigarettes. She smoked an average of 1 pack per day. She has never used smokeless tobacco. She reports current alcohol use. She reports that she does not use drugs.  She has a current medication list which includes the following prescription(s): acetaminophen, albuterol, ibuprofen, pantoprazole, sucralfate, and [DISCONTINUED] sumatriptan. Also, is allergic to codeine.  Review of Systems   Genitourinary:  Positive for dysuria. Negative for flank pain, frequency, hematuria and urgency.   Objective: BP 130/70    Ht 5\' 9"  (1.753 m)    Wt (!) 308 lb (139.7 kg)    LMP 09/15/2021 (Exact Date)    BMI 45.48 kg/m  Physical Exam Genitourinary:     Genitourinary Comments: Pinpoint red raised area on upper Right labia minora, area slightly tender and itchy when touched.   Otherwise vulva and labia intact, no discoloration or redness      Right Labia: lesions.      Pulmonary:     Effort: Pulmonary effort is normal.  Neurological:     Mental Status: She is alert.      Assessment: 41 y.o. [id number]  Vulvar itching  Lesion on Vulva STI screening   Plan: Problem List Items Addressed This Visit   None Visit Diagnoses     Itching in the vaginal area    -  Primary   Relevant Orders   HEP, RPR, HIV Panel   NuSwab Vaginitis Plus (VG+)   Screening examination for venereal disease       Relevant Orders   Hepatitis C antibody        1) Contraception -HX of Tubal ligation.   2) STI screening was offered and accepted encouraged consistent condom use       -will call with any abnormal results  3) Last pap 2019, WNL, due for annual in the Spring  4) Discussed lesion could be HSV, discussed nature of virus and recommended treatments based on symptoms  and outbreaks.   5) comfort measures reviewed for itching.   Roberto Scales, [place], [place] [facility]  09/15/21  4:30 PM

## 2021-09-16 LAB — HEPATITIS C ANTIBODY: Hep C Virus Ab: <0.1 s/co ratio (ref 0.0–0.9)

## 2021-09-16 LAB — HEP, RPR, HIV PANEL
HIV Screen 4th Generation wRfx: NONREACTIVE
Hepatitis B Surface Ag: NEGATIVE
RPR Ser Ql: NONREACTIVE

## 2021-09-18 ENCOUNTER — Encounter: Payer: Self-pay | Admitting: Licensed Practical Nurse

## 2021-09-18 LAB — NUSWAB VAGINITIS PLUS (VG+)
Candida albicans, NAA: NEGATIVE
Candida glabrata, NAA: NEGATIVE
Chlamydia trachomatis, NAA: NEGATIVE
Neisseria gonorrhoeae, NAA: NEGATIVE
Trich vag by NAA: NEGATIVE

## 2021-09-18 LAB — HERPES SIMPLEX VIRUS CULTURE

## 2022-01-25 ENCOUNTER — Emergency Department
Admission: EM | Admit: 2022-01-25 | Discharge: 2022-01-25 | Disposition: A | Discharge status: Home / Self Care | Payer: BC Managed Care – PPO | Attending: Emergency Medicine | Admitting: Emergency Medicine

## 2022-01-25 ENCOUNTER — Emergency Department: Payer: BC Managed Care – PPO

## 2022-01-25 ENCOUNTER — Encounter: Payer: Self-pay | Admitting: Emergency Medicine

## 2022-01-25 DIAGNOSIS — X58XXXD Exposure to other specified factors, subsequent encounter: Secondary | ICD-10-CM | POA: Diagnosis not present

## 2022-01-25 DIAGNOSIS — S39012D Strain of muscle, fascia and tendon of lower back, subsequent encounter: Secondary | ICD-10-CM | POA: Insufficient documentation

## 2022-01-25 MED ORDER — OXYCODONE-ACETAMINOPHEN 5-325 MG PO TABS: 1.0000 | ORAL_TABLET | ORAL | 0 refills | Status: DC | PRN

## 2022-01-25 MED ORDER — OXYCODONE-ACETAMINOPHEN 5-325 MG PO TABS
1.0000 | ORAL_TABLET | Freq: Once | ORAL | Status: AC
  Administered 2022-01-25: 1 via ORAL
  Filled 2022-01-25: qty 1

## 2022-01-25 MED ORDER — LIDOCAINE 5 % EX PTCH
1.0000 | MEDICATED_PATCH | CUTANEOUS | Status: DC
  Administered 2022-01-25: 1 via TRANSDERMAL
  Filled 2022-01-25: qty 1

## 2022-01-25 MED ORDER — LIDOCAINE 5 % EX PTCH: 1.0000 | MEDICATED_PATCH | Freq: Two times a day (BID) | CUTANEOUS | 0 refills | Status: AC

## 2022-01-25 MED ORDER — KETOROLAC TROMETHAMINE 30 MG/ML IJ SOLN
30.0000 mg | Freq: Once | INTRAMUSCULAR | Status: AC
  Administered 2022-01-25: 30 mg via INTRAMUSCULAR
  Filled 2022-01-25: qty 1

## 2022-01-25 NOTE — ED Provider Notes (Signed)
[facility] [place] Provider Note    Event Date/Time   First MD Initiated Contact with Patient 01/25/22 [phone]     (approximate)   History   Chief Complaint Back Pain   HPI  Kaitlyn Hall is a 41 y.o. female with past medical history of migraines, anemia, and GERD who presents to the ED complaining of back pain.  Patient reports that she has been dealing with about 1 week of pain in the middle of her lower back moving to the right side.  Pain is described as a constant pulling that is worse with bending and certain positions.  She denies any falls or other trauma to her back.  Pain does not radiate down either leg and she denies any numbness or weakness in her legs.  She has not had any numbness in her groin and denies any bowel or bladder incontinence.  She has been taking muscle relaxants, NSAIDs, and Tylenol for her symptoms without relief.  She has not had any abdominal pain, flank pain, dysuria, or fever.  She reports similar issues with her back in the past, but never this severe.     Physical Exam   Triage Vital Signs: ED Triage Vitals  Enc Vitals Group     BP 01/25/22 0127 136/90     Pulse Rate 01/25/22 0127 91     Resp 01/25/22 0127 18     Temp 01/25/22 0127 98.1 F (36.7 C)     Temp Source 01/25/22 0127 Oral     SpO2 01/25/22 0127 99 %     Weight 01/25/22 0125 298 lb (135.2 kg)     Height 01/25/22 0125 5\' 9"  (1.753 m)     Head Circumference --      Peak Flow --      Pain Score 01/25/22 0125 9     Pain Loc --      Pain Edu? --      Excl. in GC? --     Most recent vital signs: Vitals:   01/25/22 0127  BP: 136/90  Pulse: 91  Resp: 18  Temp: 98.1 F (36.7 C)  SpO2: 99%    Constitutional: Alert and oriented. Eyes: Conjunctivae are normal. Head: Atraumatic. Nose: No congestion/rhinnorhea. Mouth/Throat: Mucous membranes are moist.  Cardiovascular: Normal rate, regular rhythm. Grossly normal heart sounds.  2+ radial pulses  bilaterally. Respiratory: Normal respiratory effort.  No retractions. Lungs CTAB. Gastrointestinal: Soft and nontender. No distention. Musculoskeletal: No lower extremity tenderness nor edema.  Midline lumbar spinal tenderness to palpation noted with right paraspinal tenderness to palpation. Neurologic:  Normal speech and language. No gross focal neurologic deficits are appreciated.    ED Results / Procedures / Treatments   Labs (all labs ordered are listed, but only abnormal results are displayed) Labs Reviewed - No data to display  RADIOLOGY Lumbar spine x-ray reviewed and interpreted by me with no fracture or dislocation.  PROCEDURES:  Critical Care performed: No  Procedures   MEDICATIONS ORDERED IN ED: Medications  lidocaine (LIDODERM) 5 % 1 patch (1 patch Transdermal Patch Applied 01/25/22 0445)  oxyCODONE-acetaminophen (PERCOCET/ROXICET) 5-325 MG per tablet 1 tablet (1 tablet Oral Given 01/25/22 0130)  ketorolac (TORADOL) 30 MG/ML injection 30 mg (30 mg Intramuscular Given 01/25/22 0445)     IMPRESSION / MDM / ASSESSMENT AND PLAN / ED COURSE  I reviewed the triage vital signs and the nursing notes.  41 y.o. female with past medical history of migraines, GERD, and anemia who presents to the ED complaining of 1 week of midline and right lateral lumbar spinal pain.  Differential diagnosis includes, but is not limited to, lumbar strain, lumbar radiculopathy, cauda equina.  Patient well-appearing and in no acute distress, vital signs are unremarkable.  She has no concerning features for cauda equina and x-rays of her lumbar spine are unremarkable.  Given pain is reproducible with palpation of her midline lumbar spine as well as her right paraspinal area, symptoms seem consistent with lumbar strain.  She is appropriate for outpatient management and we will give dose of IM Toradol here in the ED.  She will be prescribed a small amount of pain medication,  was counseled to continue NSAIDs as well as Lidoderm patches.  She was counseled to follow-up with her PCP for potential physical therapy and to return to the ED for new or worsening symptoms.  Patient agrees with plan.      FINAL CLINICAL IMPRESSION(S) / ED DIAGNOSES   Final diagnoses:  Strain of lumbar region, subsequent encounter     Rx / DC Orders   ED Discharge Orders          Ordered    lidocaine (LIDODERM) 5 %  Every 12 hours        01/25/22 0442    oxyCODONE-acetaminophen (PERCOCET) 5-325 MG tablet  Every 4 hours PRN        01/25/22 0442             Note:  This document was prepared using Dragon voice recognition software and may include unintentional dictation errors.   Chesley Noon, MD 01/25/22 [phone]

## 2022-01-25 NOTE — ED Triage Notes (Signed)
Pt presents via POV with complaints of lower back pain that started on Sunday. Seen at [facility] and she was prescribed pain meds & muscle relaxer and has had no improvement. Denies falls, loss of control of her bowels, or any injury to the location.

## 2022-01-25 NOTE — ED Notes (Signed)
Discharge instructions, including pain management / prescription, discussed with pt. Pt verbalized understanding with no questions at this time. Pt ambulatory at discharge. Printed work note provided for pt

## 2022-06-13 DIAGNOSIS — Z87891 Personal history of nicotine dependence: Secondary | ICD-10-CM | POA: Diagnosis not present

## 2022-06-13 DIAGNOSIS — R350 Frequency of micturition: Secondary | ICD-10-CM | POA: Diagnosis not present

## 2022-06-13 DIAGNOSIS — Z885 Allergy status to narcotic agent status: Secondary | ICD-10-CM | POA: Diagnosis not present

## 2022-06-13 DIAGNOSIS — K219 Gastro-esophageal reflux disease without esophagitis: Secondary | ICD-10-CM | POA: Diagnosis not present

## 2022-06-13 DIAGNOSIS — M79606 Pain in leg, unspecified: Secondary | ICD-10-CM | POA: Diagnosis not present

## 2022-06-13 DIAGNOSIS — R202 Paresthesia of skin: Secondary | ICD-10-CM | POA: Diagnosis not present

## 2022-06-13 DIAGNOSIS — Z79899 Other long term (current) drug therapy: Secondary | ICD-10-CM | POA: Diagnosis not present

## 2022-06-13 DIAGNOSIS — M545 Low back pain, unspecified: Secondary | ICD-10-CM | POA: Diagnosis not present

## 2022-06-21 ENCOUNTER — Emergency Department: Payer: BC Managed Care – PPO

## 2022-06-21 ENCOUNTER — Emergency Department
Admission: EM | Admit: 2022-06-21 | Discharge: 2022-06-21 | Disposition: A | Discharge status: Home / Self Care | Payer: BC Managed Care – PPO | Attending: Emergency Medicine | Admitting: Emergency Medicine

## 2022-06-21 ENCOUNTER — Encounter: Payer: Self-pay | Admitting: Emergency Medicine

## 2022-06-21 ENCOUNTER — Other Ambulatory Visit: Payer: Self-pay

## 2022-06-21 DIAGNOSIS — S161XXA Strain of muscle, fascia and tendon at neck level, initial encounter: Secondary | ICD-10-CM | POA: Insufficient documentation

## 2022-06-21 DIAGNOSIS — M79662 Pain in left lower leg: Secondary | ICD-10-CM | POA: Diagnosis not present

## 2022-06-21 DIAGNOSIS — R079 Chest pain, unspecified: Secondary | ICD-10-CM | POA: Insufficient documentation

## 2022-06-21 DIAGNOSIS — Y92410 Unspecified street and highway as the place of occurrence of the external cause: Secondary | ICD-10-CM | POA: Insufficient documentation

## 2022-06-21 DIAGNOSIS — Z041 Encounter for examination and observation following transport accident: Secondary | ICD-10-CM | POA: Diagnosis not present

## 2022-06-21 DIAGNOSIS — R519 Headache, unspecified: Secondary | ICD-10-CM | POA: Insufficient documentation

## 2022-06-21 DIAGNOSIS — S4991XA Unspecified injury of right shoulder and upper arm, initial encounter: Secondary | ICD-10-CM | POA: Diagnosis present

## 2022-06-21 DIAGNOSIS — S42001A Fracture of unspecified part of right clavicle, initial encounter for closed fracture: Secondary | ICD-10-CM | POA: Diagnosis not present

## 2022-06-21 DIAGNOSIS — S39012A Strain of muscle, fascia and tendon of lower back, initial encounter: Secondary | ICD-10-CM | POA: Diagnosis not present

## 2022-06-21 DIAGNOSIS — M542 Cervicalgia: Secondary | ICD-10-CM | POA: Diagnosis not present

## 2022-06-21 DIAGNOSIS — M545 Low back pain, unspecified: Secondary | ICD-10-CM | POA: Diagnosis not present

## 2022-06-21 DIAGNOSIS — S8012XA Contusion of left lower leg, initial encounter: Secondary | ICD-10-CM | POA: Diagnosis not present

## 2022-06-21 MED ORDER — OXYCODONE-ACETAMINOPHEN 5-325 MG PO TABS
1.0000 | ORAL_TABLET | Freq: Once | ORAL | Status: AC
  Administered 2022-06-21: 1 via ORAL
  Filled 2022-06-21: qty 1

## 2022-06-21 MED ORDER — MELOXICAM 15 MG PO TABS: 15.0000 mg | ORAL_TABLET | Freq: Every day | ORAL | 2 refills | Status: AC

## 2022-06-21 MED ORDER — BACLOFEN 10 MG PO TABS: 10.0000 mg | ORAL_TABLET | Freq: Three times a day (TID) | ORAL | 0 refills | Status: AC

## 2022-06-21 MED ORDER — OXYCODONE-ACETAMINOPHEN 5-325 MG PO TABS: 1.0000 | ORAL_TABLET | ORAL | 0 refills | Status: DC | PRN

## 2022-06-21 MED ORDER — IPRATROPIUM-ALBUTEROL 0.5-2.5 (3) MG/3ML IN SOLN
3.0000 mL | Freq: Once | RESPIRATORY_TRACT | Status: AC
  Administered 2022-06-21: 3 mL via RESPIRATORY_TRACT
  Filled 2022-06-21: qty 3
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
## 2022-06-21 NOTE — Discharge Instructions (Signed)
Follow-up with your regular doctor as needed.  Follow-up with orthopedics, call for an appointment until we have a broken clavicle.  Apply ice to the clavicle.  Take the medications as prescribed.  Return if worsening

## 2022-06-25 DIAGNOSIS — S8011XD Contusion of right lower leg, subsequent encounter: Secondary | ICD-10-CM | POA: Diagnosis not present

## 2022-06-25 DIAGNOSIS — I10 Essential (primary) hypertension: Secondary | ICD-10-CM | POA: Diagnosis not present

## 2022-06-25 DIAGNOSIS — S4351XA Sprain of right acromioclavicular joint, initial encounter: Secondary | ICD-10-CM | POA: Diagnosis not present

## 2022-06-25 DIAGNOSIS — S8012XD Contusion of left lower leg, subsequent encounter: Secondary | ICD-10-CM | POA: Diagnosis not present

## 2022-06-25 DIAGNOSIS — R112 Nausea with vomiting, unspecified: Secondary | ICD-10-CM | POA: Diagnosis not present

## 2022-06-25 DIAGNOSIS — R519 Headache, unspecified: Secondary | ICD-10-CM | POA: Diagnosis not present

## 2022-06-25 DIAGNOSIS — M25511 Pain in right shoulder: Secondary | ICD-10-CM | POA: Diagnosis not present

## 2022-07-16 ENCOUNTER — Emergency Department
Admission: EM | Admit: 2022-07-16 | Discharge: 2022-07-16 | Disposition: A | Discharge status: Home / Self Care | Payer: BC Managed Care – PPO | Attending: Emergency Medicine | Admitting: Emergency Medicine

## 2022-07-16 ENCOUNTER — Emergency Department: Payer: BC Managed Care – PPO

## 2022-07-16 ENCOUNTER — Other Ambulatory Visit: Payer: Self-pay

## 2022-07-16 ENCOUNTER — Encounter: Payer: Self-pay | Admitting: Emergency Medicine

## 2022-07-16 DIAGNOSIS — M545 Low back pain, unspecified: Secondary | ICD-10-CM | POA: Diagnosis not present

## 2022-07-16 DIAGNOSIS — M5126 Other intervertebral disc displacement, lumbar region: Secondary | ICD-10-CM | POA: Diagnosis not present

## 2022-07-16 DIAGNOSIS — Y92410 Unspecified street and highway as the place of occurrence of the external cause: Secondary | ICD-10-CM | POA: Insufficient documentation

## 2022-07-16 DIAGNOSIS — M5416 Radiculopathy, lumbar region: Secondary | ICD-10-CM | POA: Diagnosis not present

## 2022-07-16 DIAGNOSIS — M5127 Other intervertebral disc displacement, lumbosacral region: Secondary | ICD-10-CM

## 2022-07-16 DIAGNOSIS — S33141A Dislocation of L4/L5 lumbar vertebra, initial encounter: Secondary | ICD-10-CM | POA: Diagnosis not present

## 2022-07-16 DIAGNOSIS — S3992XA Unspecified injury of lower back, initial encounter: Secondary | ICD-10-CM | POA: Diagnosis present

## 2022-07-16 DIAGNOSIS — M4807 Spinal stenosis, lumbosacral region: Secondary | ICD-10-CM | POA: Diagnosis not present

## 2022-07-16 MED ORDER — DEXAMETHASONE SODIUM PHOSPHATE 10 MG/ML IJ SOLN
10.0000 mg | Freq: Once | INTRAMUSCULAR | Status: DC
  Filled 2022-07-16: qty 1

## 2022-07-16 MED ORDER — PREDNISONE 10 MG (21) PO TBPK: ORAL_TABLET | ORAL | 0 refills | Status: DC

## 2022-07-16 MED ORDER — KETOROLAC TROMETHAMINE 15 MG/ML IJ SOLN
15.0000 mg | Freq: Once | INTRAMUSCULAR | Status: AC
  Administered 2022-07-16: 15 mg via INTRAVENOUS

## 2022-07-16 MED ORDER — NAPROXEN 500 MG PO TABS: 500.0000 mg | ORAL_TABLET | Freq: Two times a day (BID) | ORAL | 0 refills | Status: AC

## 2022-07-16 MED ORDER — LIDOCAINE 5 % EX PTCH: 1.0000 | MEDICATED_PATCH | Freq: Two times a day (BID) | CUTANEOUS | 0 refills | Status: AC

## 2022-07-16 MED ORDER — KETOROLAC TROMETHAMINE 15 MG/ML IJ SOLN
15.0000 mg | Freq: Once | INTRAMUSCULAR | Status: DC
  Filled 2022-07-16: qty 1

## 2022-07-16 MED ORDER — LIDOCAINE 5 % EX PTCH
1.0000 | MEDICATED_PATCH | CUTANEOUS | Status: DC
Start: 2022-07-16 — End: 2022-07-16
  Administered 2022-07-16: 1 via TRANSDERMAL
  Filled 2022-07-16: qty 1

## 2022-07-16 MED ORDER — DEXAMETHASONE SODIUM PHOSPHATE 10 MG/ML IJ SOLN
10.0000 mg | Freq: Once | INTRAMUSCULAR | Status: AC
  Administered 2022-07-16: 10 mg via INTRAVENOUS

## 2022-07-16 NOTE — ED Triage Notes (Signed)
Pt arrived via POV with reports of right side back pain shooting down R leg, pt states she was in [facility] on 10/25, has been seen at [facility] and [facility] ED for pain, pt states she has been on steroids and pain meds and has followed up with PCP and seen ortho for collarbone.   Per not pt was dx by ortho with AC ligament strain.  Pt states she is unable to get comfortable in any position. States she has a follow up with PCP on 11/30.
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
## 2022-07-16 NOTE — Discharge Instructions (Addendum)
Your CT reveals a disc herniation at L5-S1. Please follow up with Dr. Myer Haff.  Please return to the emergency department for any new, worsening, or changing symptoms or other concerns including weakness in your legs, urinary or stool incontinence or retention, numbness or tingling in your extremities/buttocks/groin, fevers, or any other concerns or change in symptoms.
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
## 2022-07-18 ENCOUNTER — Telehealth: Payer: Self-pay | Admitting: Neurosurgery
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
## 2023-03-04 DIAGNOSIS — Z87891 Personal history of nicotine dependence: Secondary | ICD-10-CM | POA: Diagnosis not present

## 2023-03-04 DIAGNOSIS — I10 Essential (primary) hypertension: Secondary | ICD-10-CM | POA: Diagnosis not present

## 2023-03-04 DIAGNOSIS — K219 Gastro-esophageal reflux disease without esophagitis: Secondary | ICD-10-CM | POA: Diagnosis not present

## 2023-03-04 DIAGNOSIS — Z79899 Other long term (current) drug therapy: Secondary | ICD-10-CM | POA: Diagnosis not present

## 2023-03-04 DIAGNOSIS — E669 Obesity, unspecified: Secondary | ICD-10-CM | POA: Diagnosis not present

## 2023-03-04 DIAGNOSIS — R109 Unspecified abdominal pain: Secondary | ICD-10-CM | POA: Diagnosis not present

## 2023-03-04 DIAGNOSIS — N912 Amenorrhea, unspecified: Secondary | ICD-10-CM | POA: Diagnosis not present

## 2023-03-04 DIAGNOSIS — R110 Nausea: Secondary | ICD-10-CM | POA: Diagnosis not present

## 2023-05-31 DIAGNOSIS — K219 Gastro-esophageal reflux disease without esophagitis: Secondary | ICD-10-CM | POA: Diagnosis not present

## 2023-05-31 DIAGNOSIS — Z113 Encounter for screening for infections with a predominantly sexual mode of transmission: Secondary | ICD-10-CM | POA: Diagnosis not present

## 2023-05-31 DIAGNOSIS — Z1389 Encounter for screening for other disorder: Secondary | ICD-10-CM | POA: Diagnosis not present

## 2023-05-31 DIAGNOSIS — Z1159 Encounter for screening for other viral diseases: Secondary | ICD-10-CM | POA: Diagnosis not present

## 2023-05-31 DIAGNOSIS — H538 Other visual disturbances: Secondary | ICD-10-CM | POA: Diagnosis not present

## 2023-05-31 DIAGNOSIS — Z0131 Encounter for examination of blood pressure with abnormal findings: Secondary | ICD-10-CM | POA: Diagnosis not present

## 2023-05-31 DIAGNOSIS — I10 Essential (primary) hypertension: Secondary | ICD-10-CM | POA: Diagnosis not present

## 2023-06-13 ENCOUNTER — Emergency Department
Admission: EM | Admit: 2023-06-13 | Discharge: 2023-06-13 | Disposition: A | Discharge status: Home / Self Care | Payer: 59 | Attending: Emergency Medicine | Admitting: Emergency Medicine

## 2023-06-13 ENCOUNTER — Emergency Department: Payer: 59

## 2023-06-13 DIAGNOSIS — R051 Acute cough: Secondary | ICD-10-CM

## 2023-06-13 DIAGNOSIS — J181 Lobar pneumonia, unspecified organism: Secondary | ICD-10-CM | POA: Diagnosis not present

## 2023-06-13 DIAGNOSIS — R0789 Other chest pain: Secondary | ICD-10-CM | POA: Diagnosis not present

## 2023-06-13 DIAGNOSIS — Z20822 Contact with and (suspected) exposure to covid-19: Secondary | ICD-10-CM | POA: Insufficient documentation

## 2023-06-13 DIAGNOSIS — J189 Pneumonia, unspecified organism: Secondary | ICD-10-CM

## 2023-06-13 DIAGNOSIS — R000 Tachycardia, unspecified: Secondary | ICD-10-CM | POA: Insufficient documentation

## 2023-06-13 DIAGNOSIS — R0602 Shortness of breath: Secondary | ICD-10-CM | POA: Diagnosis not present

## 2023-06-13 LAB — RESP PANEL BY RT-PCR (RSV, FLU A&B, COVID)  RVPGX2
Influenza A by PCR: NEGATIVE
Influenza B by PCR: NEGATIVE
Resp Syncytial Virus by PCR: NEGATIVE
SARS Coronavirus 2 by RT PCR: NEGATIVE

## 2023-06-13 LAB — BASIC METABOLIC PANEL
Anion gap: 7 (ref 5–15)
BUN: 11 mg/dL (ref 6–20)
CO2: 25 mmol/L (ref 22–32)
Calcium: 8.7 mg/dL — ABNORMAL LOW (ref 8.9–10.3)
Chloride: 107 mmol/L (ref 98–111)
Creatinine, Ser: 0.79 mg/dL (ref 0.44–1.00)
GFR, Estimated: >60 mL/min (ref >60)
Glucose, Bld: 100 mg/dL — ABNORMAL HIGH (ref 70–99)
Potassium: 3.7 mmol/L (ref 3.5–5.1)
Sodium: 139 mmol/L (ref 135–145)

## 2023-06-13 LAB — D-DIMER, QUANTITATIVE: D-Dimer, Quant: 0.34 ug{FEU}/mL (ref 0.00–0.50)

## 2023-06-13 LAB — CBC
HCT: 36.5 % (ref 36.0–46.0)
Hemoglobin: 12.2 g/dL (ref 12.0–15.0)
MCH: 26.8 pg (ref 26.0–34.0)
MCHC: 33.4 g/dL (ref 30.0–36.0)
MCV: 80 fL (ref 80.0–100.0)
Platelets: 378 10*3/uL (ref 150–400)
RBC: 4.56 MIL/uL (ref 3.87–5.11)
RDW: 13.9 % (ref 11.5–15.5)
WBC: 16.3 10*3/uL — ABNORMAL HIGH (ref 4.0–10.5)
nRBC: 0 % (ref 0.0–0.2)

## 2023-06-13 LAB — TROPONIN I (HIGH SENSITIVITY)
Troponin I (High Sensitivity): 4 ng/L (ref <18)
Troponin I (High Sensitivity): 5 ng/L (ref <18)

## 2023-06-13 LAB — GROUP A STREP BY PCR: Group A Strep by PCR: NOT DETECTED

## 2023-06-13 MED ORDER — DOXYCYCLINE HYCLATE 100 MG PO TABS: 100.0000 mg | ORAL_TABLET | Freq: Two times a day (BID) | ORAL | 0 refills | Status: AC

## 2023-06-13 MED ORDER — KETOROLAC TROMETHAMINE 30 MG/ML IJ SOLN
15.0000 mg | Freq: Once | INTRAMUSCULAR | Status: AC
  Administered 2023-06-13: 15 mg via INTRAVENOUS
  Filled 2023-06-13: qty 1

## 2023-06-13 MED ORDER — HYDROCOD POLI-CHLORPHE POLI ER 10-8 MG/5ML PO SUER
5.0000 mL | Freq: Once | ORAL | Status: AC
  Administered 2023-06-13: 5 mL via ORAL
  Filled 2023-06-13: qty 5

## 2023-06-13 MED ORDER — HYDROCOD POLI-CHLORPHE POLI ER 10-8 MG/5ML PO SUER: 5.0000 mL | Freq: Two times a day (BID) | ORAL | 0 refills | Status: DC | PRN

## 2023-06-13 MED ORDER — DOXYCYCLINE HYCLATE 100 MG PO TABS
100.0000 mg | ORAL_TABLET | Freq: Once | ORAL | Status: AC
  Administered 2023-06-13: 100 mg via ORAL
  Filled 2023-06-13: qty 1

## 2023-06-13 NOTE — ED Triage Notes (Signed)
Patient ambulatory to triage with complaints of cough and sore throat that started at the beginning of this week and worsened tonight. Patient states she feels short of breath and that she is getting green mucus up with her cough.

## 2023-06-13 NOTE — Discharge Instructions (Addendum)
Please take Tylenol and ibuprofen/Advil for your pain.  It is safe to take them together, or to alternate them every few hours.  Take up to 1000mg  of Tylenol at a time, up to 4 times per day.  Do not take more than 4000 mg of Tylenol in 24 hours.  For ibuprofen, take 400-600 mg, 3 - 4 times per day.  Doxycycline antibiotic twice daily for 5 days  Tussionex liquid cough medicine as needed twice daily

## 2023-06-13 NOTE — ED Provider Notes (Signed)
[facility] Provider Note    Event Date/Time   First MD Initiated Contact with Patient 06/13/23 [phone]     (approximate)   History   Shortness of Breath and Cough   HPI  Kaitlyn Hall is a 42 y.o. female who presents to the ED for evaluation of Shortness of Breath and Cough   Review of PCP visit from last year.  History of GERD.  She presents to the ED for evaluation of shortness of breath, cough and chest pain progressive over the past couple days.  Symptoms started 3 to 4 days ago with a sore throat and quickly progressed to shortness of breath, minimally productive cough and chest pressure   Physical Exam   Triage Vital Signs: ED Triage Vitals  Encounter Vitals Group     BP 06/13/23 0318 (!) 155/93     Systolic BP Percentile --      Diastolic BP Percentile --      Pulse Rate 06/13/23 0318 98     Resp 06/13/23 0318 18     Temp 06/13/23 0318 98.1 F (36.7 C)     Temp src --      SpO2 06/13/23 0318 96 %     Weight 06/13/23 0319 280 lb (127 kg)     Height 06/13/23 0319 5\' 9"  (1.753 m)     Head Circumference --      Peak Flow --      Pain Score 06/13/23 0323 9     Pain Loc --      Pain Education --      Exclude from Growth Chart --     Most recent vital signs: Vitals:   06/13/23 0318  BP: (!) 155/93  Pulse: 98  Resp: 18  Temp: 98.1 F (36.7 C)  SpO2: 96%    General: Awake, no distress.  Seems uncomfortable CV:  Good peripheral perfusion.  Tachycardic and regular Resp:  Normal effort.  No wheezing Abd:  No distention.  Soft MSK:  No deformity noted.  No swelling Neuro:  No focal deficits appreciated. Other:     ED Results / Procedures / Treatments   Labs (all labs ordered are listed, but only abnormal results are displayed) Labs Reviewed  BASIC METABOLIC PANEL - Abnormal; Notable for the following components:      Result Value   Glucose, Bld 100 (*)    Calcium 8.7 (*)    All other components within normal limits  CBC -  Abnormal; Notable for the following components:   WBC 16.3 (*)    All other components within normal limits  RESP PANEL BY RT-PCR (RSV, FLU A&B, COVID)  [id number]  GROUP A STREP BY PCR  D-DIMER, QUANTITATIVE  POC URINE PREG, ED  TROPONIN I (HIGH SENSITIVITY)  TROPONIN I (HIGH SENSITIVITY)    EKG Sinus tachycardia with rate of 106 bpm.  Normal axis and intervals.  No STEMI.  Nonspecific ST changes to inferior leads.  RADIOLOGY 2 view CXR interpreted by me with streaky opacity to the right base  Official radiology report(s): DG Chest 2 View  Result Date: 06/13/2023 CLINICAL DATA:  42 year old female with history of shortness of breath. EXAM: CHEST - 2 VIEW COMPARISON:  Chest x-ray 06/21/2022. FINDINGS: Lung volumes are normal. No consolidative airspace disease. No pleural effusions. No pneumothorax. No pulmonary nodule or mass noted. Pulmonary vasculature and the cardiomediastinal silhouette are within normal limits. IMPRESSION: No radiographic evidence of acute cardiopulmonary disease. Electronically Signed  By: Trudie Reed M.D.   On: 06/13/2023 06:04    PROCEDURES and INTERVENTIONS:  .1-3 Lead EKG Interpretation  Performed by: Delton Prairie, MD Authorized by: Delton Prairie, MD     Interpretation: abnormal     ECG rate:  103   ECG rate assessment: tachycardic     Rhythm: sinus tachycardia     Ectopy: none     Conduction: normal     Medications  doxycycline (VIBRA-TABS) tablet 100 mg (100 mg Oral Given 06/13/23 0534)  ketorolac (TORADOL) 30 MG/ML injection 15 mg (15 mg Intravenous Given 06/13/23 0559)  chlorpheniramine-HYDROcodone (TUSSIONEX) 10-8 MG/5ML suspension 5 mL (5 mLs Oral Given 06/13/23 0600)     IMPRESSION / MDM / ASSESSMENT AND PLAN / ED COURSE  I reviewed the triage vital signs and the nursing notes.  Differential diagnosis includes, but is not limited to, ACS, PTX, PNA, muscle strain/spasm, PE, dissection, anxiety, pleural effusion  {Patient presents with  symptoms of an acute illness or injury that is potentially life-threatening.  42 year old woman presents to the ED with shortness of breath, cough and chest pain.  Frequently coughing, productive cough and has a leukocytosis questioning community-acquired pneumonia.  We have been having delays with radiology reads tonight and her 2 view x-ray has small streaky opacity to the right base so she was started on doxycycline.  This is subsequently read as normal by radiology around the time of discharge but I do believe a short course of doxycycline to be reasonable considering her presenting symptoms and leukocytosis.  Otherwise, she is benign workup.  Negative troponin, viral and strep swabs, negative D-dimer and I think PE is quite unlikely.  Clinical Course as of 06/13/23 4098  Thu Jun 13, 2023  0612 Reassessed and discussed plan of care [DS]    Clinical Course User Index [DS] Delton Prairie, MD     FINAL CLINICAL IMPRESSION(S) / ED DIAGNOSES   Final diagnoses:  Other chest pain  Acute cough  Community acquired pneumonia of right lower lobe of lung     Rx / DC Orders   ED Discharge Orders          Ordered    chlorpheniramine-HYDROcodone (TUSSIONEX) 10-8 MG/5ML  Every 12 hours PRN        06/13/23 0624    doxycycline (VIBRA-TABS) 100 MG tablet  2 times daily        06/13/23 1191             Note:  This document was prepared using Dragon voice recognition software and may include unintentional dictation errors.   Delton Prairie, MD 06/13/23 [phone]

## 2023-06-15 NOTE — Plan of Care (Signed)
CHL Tonsillectomy/Adenoidectomy, Postoperative PEDS care plan entered in error.

## 2024-01-27 ENCOUNTER — Encounter: Payer: Self-pay | Admitting: Licensed Practical Nurse

## 2024-01-27 ENCOUNTER — Ambulatory Visit (INDEPENDENT_AMBULATORY_CARE_PROVIDER_SITE_OTHER): Payer: Self-pay | Admitting: Licensed Practical Nurse

## 2024-01-27 ENCOUNTER — Other Ambulatory Visit (HOSPITAL_COMMUNITY)
Admission: RE | Admit: 2024-01-27 | Discharge: 2024-01-27 | Disposition: A | Discharge status: Home / Self Care | Source: Ambulatory Visit | Attending: Licensed Practical Nurse | Admitting: Licensed Practical Nurse

## 2024-01-27 VITALS — BP 153/94 | HR 71 | Ht 69.0 in | Wt 303.2 lb

## 2024-01-27 DIAGNOSIS — Z1231 Encounter for screening mammogram for malignant neoplasm of breast: Secondary | ICD-10-CM

## 2024-01-27 DIAGNOSIS — Z113 Encounter for screening for infections with a predominantly sexual mode of transmission: Secondary | ICD-10-CM

## 2024-01-27 DIAGNOSIS — Z124 Encounter for screening for malignant neoplasm of cervix: Secondary | ICD-10-CM

## 2024-01-27 DIAGNOSIS — L0292 Furuncle, unspecified: Secondary | ICD-10-CM

## 2024-01-27 DIAGNOSIS — Z13220 Encounter for screening for lipoid disorders: Secondary | ICD-10-CM

## 2024-01-27 DIAGNOSIS — Z01419 Encounter for gynecological examination (general) (routine) without abnormal findings: Secondary | ICD-10-CM | POA: Insufficient documentation

## 2024-01-27 DIAGNOSIS — Z131 Encounter for screening for diabetes mellitus: Secondary | ICD-10-CM

## 2024-01-27 NOTE — Progress Notes (Signed)
 Gynecology Annual Exam  [facility]P: [facility]  Chief Complaint:  Chief Complaint  Patient presents with   Gynecologic Exam    History of Present Illness: Patient is a 43 y.o. Kaitlyn Hall presents for annual exam. The patient reports: -recurrent boils on her thighs, they will start as small bumps then get bigger, hard and painful and then go away. On occasion they do open. They happen frequently, around the time of her cycle. She had on under her left breast and now has a scar there.  She notices that when she uses a product to balance her vaginal pH the boils go away.   CHTN: diagnosed about 3 years ago, on amlodipine, has not taken I today and does not have it on her   Perimenopausal sxs: mood has been irritable, vaginal irritation and dryness, and feeling hot out nowhere, and changes to  her sex drive, She is not  sure when her mother went through menopause.    LMP: Patient's last menstrual period was 01/12/2024 (approximate). Average Interval: regular, 21-28 days Duration of flow: 4 days Heavy Menses: yes day 2, changes products frequently  Clots: no Intermenstrual Bleeding: no Postcoital Bleeding: no Dysmenorrhea: yes, sometimes, resolves with Midol  and Tylenol     The patient is sexually active with one female partner. She currently uses tubal ligation for contraception. She occasionally has  dyspareunia, notices she can be dry at times.  The patient does perform self breast exams.  There is no notable family history of breast or ovarian cancer in her family.  The patient wears seatbelts: yes.   The patient has regular exercise: yes.  Pt is interested in weight loss and improving her health, plans to go to the gym and/or walking three times a week, already goes walking.   The patient denies current symptoms of depression.   Describes stress level as high, prays and reads the bible to cope  Works as a Chartered certified accountant for [employer], works from home   Lives with her children ages 24, 17, 60 and 36 Has a boyfriend, feels safe  PCP: switching to [facility] apt 6/13 Dentist last exam 3 years ago Wears glasses has eye exam later today  Derm has not gone   Review of Systems: ROS see HPI   Past Medical History:  Patient Active Problem List   Diagnosis Date Noted Date Diagnosed   Pelvic pain     Adhesion of omentum      Past Surgical History:  Past Surgical History:  Procedure Laterality Date   CESAREAN SECTION  2010   LAPAROSCOPIC LYSIS OF ADHESIONS N/A 07/19/2020   Procedure: LAPAROSCOPIC LYSIS OF ADHESIONS;  Surgeon: Darl Edu, MD;  Location: [facility] ORS;  Service: Gynecology;  Laterality: N/A;   LAPAROSCOPY N/A 07/19/2020   Procedure: LAPAROSCOPY DIAGNOSTIC;  Surgeon: Darl Edu, MD;  Location: [facility] ORS;  Service: Gynecology;  Laterality: N/A;   TUBAL LIGATION  2010    Gynecologic History:  Patient's last menstrual period was 01/12/2024 (approximate). Contraception: tubal ligation Last Pap: Results were: 2019 NIL and HR HPV negative  Last mammogram: never had   Obstetric History: [id number]  Family History:  No family history on file.  Social History:  Social History   Socioeconomic History   Marital status: Single    Spouse name: Not on file   Number of children: Not on file   Years of education: Not on file   Highest education level: Not on file  Occupational  History   Not on file  Tobacco Use   Smoking status: Former    Current packs/day: 0.00    Types: Cigarettes    Quit date: 08/27/2020    Years since quitting: 3.4   Smokeless tobacco: Never  Vaping Use   Vaping status: Never Used  Substance and Sexual Activity   Alcohol use: Yes    Comment: occasionally   Drug use: No   Sexual activity: Yes    Birth control/protection: Surgical  Other Topics Concern   Not on file  Social History Narrative   Not on file   Social Drivers of Health   Financial Resource Strain: Not on file  Food  Insecurity: No Food Insecurity (01/29/2022)   Received from [facility]   Hunger Vital Sign    Worried About Running Out of Food in the Last Year: Never true    Ran Out of Food in the Last Year: Never true  Transportation Needs: Not on file  Physical Activity: Insufficiently Active (01/07/2018)   Exercise Vital Sign    Days of Exercise per Week: 1 day    Minutes of Exercise per Session: 30 min  Stress: Stress Concern Present (01/07/2018)   [employer] of Occupational Health - Occupational Stress Questionnaire    Feeling of Stress : Rather much  Social Connections: Not on file  Intimate Partner Violence: Not on file    Allergies:  Allergies  Allergen Reactions   Acetaminophen -Codeine  Hives and Swelling   Codeine  Hives    Medications: Prior to Admission medications   Medication Sig Start Date End Date Taking? Authorizing Provider  acetaminophen  (TYLENOL ) 500 MG tablet Take 1,000 mg by mouth every 6 (six) hours as needed for moderate pain.   Yes [provider]  albuterol  (VENTOLIN  HFA) 108 (90 Base) MCG/ACT inhaler Inhale 2 puffs into the lungs every 6 (six) hours as needed for wheezing or shortness of breath. 05/07/20  Yes Rodgers, Caitlin J, PA  chlorpheniramine-HYDROcodone  (TUSSIONEX) 10-8 MG/5ML Take 5 mLs by mouth every 12 (twelve) hours as needed for cough. Patient not taking: Reported on 01/27/2024 06/13/23   Arline Bennett, MD  pantoprazole  (PROTONIX ) 40 MG tablet Take 1 tablet (40 mg total) by mouth daily. 12/14/20 12/14/21  Fisher, Rufino Coulter, PA-C  predniSONE  (STERAPRED UNI-PAK 21 TAB) 10 MG (21) TBPK tablet Take 6 tablets at once on day one, and decrease by 1 tablet for each subsequent day Patient not taking: Reported on 01/27/2024 07/16/22   Poggi, Jenna E, PA-C  sucralfate  (CARAFATE ) 1 g tablet Take 1 tablet (1 g total) by mouth 4 (four) times daily. 12/14/20 12/14/21  Fisher, Rufino Coulter, PA-C  SUMAtriptan  (IMITREX ) 50 MG tablet Take 1 tablet (50 mg  total) by mouth once as needed for migraine. May repeat in 2 hours if headache persists or recurs. 03/24/16 07/12/20  Shayne Demark, MD    Physical Exam Vitals: Height 5\' 9"  (1.753 m), weight (!) 303 lb 3.2 oz (137.5 kg), last menstrual period 01/12/2024.  General: NAD HEENT: normocephalic, anicteric Thyroid: no enlargement, no palpable nodules Pulmonary: No increased work of breathing, CTAB Cardiovascular: RRR, distal pulses 2+ Breast: Breast symmetrical, no tenderness, no palpable nodules or masses, no skin or nipple retraction present, no nipple discharge.  No axillary or supraclavicular lymphadenopathy. Abdomen: NABS, soft, non-tender, non-distended.  Umbilicus without lesions.  No hepatomegaly, splenomegaly or masses palpable. No evidence of hernia  Genitourinary:  External: Normal external female genitalia.  Normal urethral meatus, normal  Bartholin's and Skene's glands.    Vagina: Normal vaginal mucosa, no evidence of prolapse.  Good tone   Cervix: Grossly normal in appearance, no bleeding  Uterus: Non-enlarged, mobile, normal contour.  No CMT  Adnexa: ovaries non-enlarged, no adnexal masses  Rectal: deferred  Lymphatic: no evidence of inguinal lymphadenopathy Extremities: no edema, erythema, or tenderness Neurologic: Grossly intact Psychiatric: mood appropriate, affect full  Skin: multiple areas on both thighs with what appear to be healed boils, one area on Right thigh 2-3cm, non tender, soft to touch.   Female chaperone present for pelvic and breast  portions of the physical exam    Assessment: 43 y.o. [id number] routine annual exam  Plan: Problem List Items Addressed This Visit   None Visit Diagnoses       Recurrent boils    -  Primary   Relevant Orders   Ambulatory referral to Dermatology     Well woman exam       Relevant Orders   HEP, RPR, HIV Panel   Hepatitis C antibody   CBC   Comprehensive metabolic panel with GFR   Hemoglobin A1c   Cervicovaginal  ancillary only   Cytology - PAP   Lipid panel     Screening examination for venereal disease       Relevant Orders   HEP, RPR, HIV Panel   Hepatitis C antibody   Cervicovaginal ancillary only     Cervical cancer screening       Relevant Orders   Cytology - PAP     Screening for diabetes mellitus       Relevant Orders   Hemoglobin A1c     Screening for lipid disorders       Relevant Orders   Lipid panel       1) Mammogram - recommend yearly screening mammogram.  Mammogram Was ordered today   2) STI screening  wasoffered and accepted  3) ASCCP guidelines and rational discussed.  Patient opts for every 3 years screening interval  4) Contraception - the patient is currently using  tubal ligation.  She is happy with her current form of contraception and plans to continue  5) Colonoscopy due at age 61-- Screening recommended starting at age 43 for average risk individuals, age 9 for individuals deemed at increased risk (including African Americans) and recommended to continue until age 82.  For patient age 5-85 individualized approach is recommended.  Gold standard screening is via colonoscopy, Cologuard screening is an acceptable alternative for patient unwilling or unable to undergo colonoscopy.  "Colorectal cancer screening for average?risk adults: 2018 guideline update from the American Cancer Society"[place]: A Cancer Journal for Clinicians: Jan 23, 2017   6) Routine healthcare maintenance including cholesterol, diabetes screening discussed managed by PCP  7) RTC 1 year   Anice Kerbs, CNM  [facility] 01/27/2024, 12:33 PM

## 2024-01-28 LAB — COMPREHENSIVE METABOLIC PANEL WITH GFR
ALT: 13 IU/L (ref 0–32)
AST: 17 IU/L (ref 0–40)
Albumin: 4.3 g/dL (ref 3.9–4.9)
Alkaline Phosphatase: 69 IU/L (ref 44–121)
BUN/Creatinine Ratio: 11 (ref 9–23)
BUN: 11 mg/dL (ref 6–24)
Bilirubin Total: 0.3 mg/dL (ref 0.0–1.2)
CO2: 21 mmol/L (ref 20–29)
Calcium: 9.5 mg/dL (ref 8.7–10.2)
Chloride: 102 mmol/L (ref 96–106)
Creatinine, Ser: 0.98 mg/dL (ref 0.57–1.00)
Globulin, Total: 2.7 g/dL (ref 1.5–4.5)
Glucose: 83 mg/dL (ref 70–99)
Potassium: 4.5 mmol/L (ref 3.5–5.2)
Sodium: 138 mmol/L (ref 134–144)
Total Protein: 7 g/dL (ref 6.0–8.5)
eGFR: 74 mL/min/{1.73_m2} (ref >59)

## 2024-01-28 LAB — CBC
Hematocrit: 39.3 % (ref 34.0–46.6)
Hemoglobin: 12.1 g/dL (ref 11.1–15.9)
MCH: 26.1 pg — ABNORMAL LOW (ref 26.6–33.0)
MCHC: 30.8 g/dL — ABNORMAL LOW (ref 31.5–35.7)
MCV: 85 fL (ref 79–97)
Platelets: 363 10*3/uL (ref 150–450)
RBC: 4.63 x10E6/uL (ref 3.77–5.28)
RDW: 14.4 % (ref 11.7–15.4)
WBC: 8.4 10*3/uL (ref 3.4–10.8)

## 2024-01-28 LAB — LIPID PANEL
Chol/HDL Ratio: 3.1 ratio (ref 0.0–4.4)
Cholesterol, Total: 157 mg/dL (ref 100–199)
HDL: 51 mg/dL (ref >39)
LDL Chol Calc (NIH): 84 mg/dL (ref 0–99)
Triglycerides: 123 mg/dL (ref 0–149)
VLDL Cholesterol Cal: 22 mg/dL (ref 5–40)

## 2024-01-28 LAB — CERVICOVAGINAL ANCILLARY ONLY
Chlamydia: NEGATIVE
Comment: NEGATIVE
Comment: NEGATIVE
Comment: NORMAL
Neisseria Gonorrhea: NEGATIVE
Trichomonas: NEGATIVE

## 2024-01-28 LAB — HEP, RPR, HIV PANEL
HIV Screen 4th Generation wRfx: NONREACTIVE
Hepatitis B Surface Ag: NEGATIVE
RPR Ser Ql: NONREACTIVE

## 2024-01-28 LAB — HEPATITIS C ANTIBODY: Hep C Virus Ab: NONREACTIVE

## 2024-01-28 LAB — HEMOGLOBIN A1C
Est. average glucose Bld gHb Est-mCnc: 117 mg/dL
Hgb A1c MFr Bld: 5.7 % — ABNORMAL HIGH (ref 4.8–5.6)

## 2024-01-29 ENCOUNTER — Ambulatory Visit: Payer: Self-pay | Admitting: Licensed Practical Nurse

## 2024-01-29 LAB — CYTOLOGY - PAP
Comment: NEGATIVE
Diagnosis: NEGATIVE
High risk HPV: NEGATIVE

## 2024-02-04 ENCOUNTER — Other Ambulatory Visit: Payer: Self-pay | Admitting: Licensed Practical Nurse

## 2024-02-04 DIAGNOSIS — Z1231 Encounter for screening mammogram for malignant neoplasm of breast: Secondary | ICD-10-CM

## 2024-02-20 ENCOUNTER — Ambulatory Visit
Admission: RE | Admit: 2024-02-20 | Discharge: 2024-02-20 | Disposition: A | Discharge status: Home / Self Care | Source: Ambulatory Visit | Attending: Licensed Practical Nurse | Admitting: Licensed Practical Nurse

## 2024-02-20 DIAGNOSIS — Z1231 Encounter for screening mammogram for malignant neoplasm of breast: Secondary | ICD-10-CM | POA: Diagnosis present

## 2024-04-28 ENCOUNTER — Ambulatory Visit: Admitting: Licensed Practical Nurse

## 2024-05-31 ENCOUNTER — Emergency Department
Admission: EM | Admit: 2024-05-31 | Discharge: 2024-05-31 | Disposition: A | Discharge status: Home / Self Care | Attending: Emergency Medicine | Admitting: Emergency Medicine

## 2024-05-31 ENCOUNTER — Emergency Department

## 2024-05-31 ENCOUNTER — Other Ambulatory Visit: Payer: Self-pay

## 2024-05-31 DIAGNOSIS — R059 Cough, unspecified: Secondary | ICD-10-CM | POA: Diagnosis present

## 2024-05-31 DIAGNOSIS — J069 Acute upper respiratory infection, unspecified: Secondary | ICD-10-CM | POA: Diagnosis not present

## 2024-05-31 LAB — RESP PANEL BY RT-PCR (RSV, FLU A&B, COVID)  RVPGX2
Influenza A by PCR: NEGATIVE
Influenza B by PCR: NEGATIVE
Resp Syncytial Virus by PCR: NEGATIVE
SARS Coronavirus 2 by RT PCR: NEGATIVE

## 2024-05-31 MED ORDER — HYDROCOD POLI-CHLORPHE POLI ER 10-8 MG/5ML PO SUER: 5.0000 mL | Freq: Every evening | ORAL | 0 refills | Status: AC | PRN

## 2024-05-31 MED ORDER — IPRATROPIUM-ALBUTEROL 0.5-2.5 (3) MG/3ML IN SOLN
3.0000 mL | Freq: Once | RESPIRATORY_TRACT | Status: AC
  Administered 2024-05-31: 3 mL via RESPIRATORY_TRACT
  Filled 2024-05-31: qty 3

## 2024-05-31 MED ORDER — PREDNISONE 50 MG PO TABS: 50.0000 mg | ORAL_TABLET | Freq: Every day | ORAL | 0 refills | Status: AC

## 2024-05-31 MED ORDER — PREDNISONE 20 MG PO TABS
60.0000 mg | ORAL_TABLET | Freq: Once | ORAL | Status: AC
  Administered 2024-05-31: 60 mg via ORAL
  Filled 2024-05-31: qty 3

## 2024-05-31 NOTE — ED Triage Notes (Signed)
 Pt c/o dry, nonproductive cough x5 days. Pt reports it takes her breath and she has a headache. Pt went to urgent care, and they told her it could be RSV, but they couldn't test for it.

## 2024-05-31 NOTE — ED Provider Notes (Signed)
 [facility] Provider Note    Event Date/Time   First MD Initiated Contact with Patient 05/31/24 1624     (approximate)   History   Cough   HPI  Kaitlyn Hall is a 43 y.o. female who presents with complaints of cough for several days, urgent care started her on steroid inhaler as well as Zyrtec.  She reports little improvement.  Has not had an x-ray.  No fevers reported.  No calf pain or pleurisy     Physical Exam   Triage Vital Signs: ED Triage Vitals  Encounter Vitals Group     BP 05/31/24 1604 (!) 180/97     Girls Systolic BP Percentile --      Girls Diastolic BP Percentile --      Boys Systolic BP Percentile --      Boys Diastolic BP Percentile --      Pulse Rate 05/31/24 1604 99     Resp 05/31/24 1604 19     Temp 05/31/24 1605 98.4 F (36.9 C)     Temp Source 05/31/24 1605 Oral     SpO2 05/31/24 1604 99 %     Weight 05/31/24 1603 (!) 142.9 kg (315 lb)     Height 05/31/24 1603 1.753 m (5' 9)     Head Circumference --      Peak Flow --      Pain Score 05/31/24 1603 9     Pain Loc --      Pain Education --      Exclude from Growth Chart --     Most recent vital signs: Vitals:   05/31/24 1605 05/31/24 1641  BP:    Pulse:    Resp:    Temp: 98.4 F (36.9 C)   SpO2:  100%     General: Awake, no distress.  CV:  Good peripheral perfusion.  Resp:  Normal effort.  Scattered mild wheezing Abd:  No distention.  Other:     ED Results / Procedures / Treatments   Labs (all labs ordered are listed, but only abnormal results are displayed) Labs Reviewed  RESP PANEL BY RT-PCR (RSV, FLU A&B, COVID)  [id number]     EKG     RADIOLOGY X-ray viewed interpret by me, no infiltrate noted    PROCEDURES:  Critical Care performed:   Procedures   MEDICATIONS ORDERED IN ED: Medications  ipratropium-albuterol  (DUONEB) 0.5-2.5 (3) MG/3ML nebulizer solution 3 mL (3 mLs Nebulization Given 05/31/24 1639)  ipratropium-albuterol   (DUONEB) 0.5-2.5 (3) MG/3ML nebulizer solution 3 mL (3 mLs Nebulization Given 05/31/24 1638)  predniSONE  (DELTASONE ) tablet 60 mg (60 mg Oral Given 05/31/24 1638)     IMPRESSION / MDM / ASSESSMENT AND PLAN / ED COURSE  I reviewed the triage vital signs and the nursing notes. Patient's presentation is most consistent with acute complicated illness / injury requiring diagnostic workup.   Patient presents with cough as detailed above, differential includes pneumonia, bronchitis, bronchospasm  Will obtain x-ray, treat with DuoNebs, p.o. prednisone , reevaluate  Patient feeling improved after treatment, chest x-ray negative for pneumonia, RSV flu negative, appropriate for discharge with supportive outpatient management     FINAL CLINICAL IMPRESSION(S) / ED DIAGNOSES   Final diagnoses:  Viral URI with cough     Rx / DC Orders   ED Discharge Orders          Ordered    predniSONE  (DELTASONE ) 50 MG tablet  Daily with breakfast  05/31/24 1807    chlorpheniramine-HYDROcodone  (TUSSIONEX) 10-8 MG/5ML  At bedtime PRN        05/31/24 1810             Note:  This document was prepared using Dragon voice recognition software and may include unintentional dictation errors.   Arlander Charleston, MD 05/31/24 [phone]

## 2024-06-22 ENCOUNTER — Other Ambulatory Visit: Payer: Self-pay

## 2024-06-22 ENCOUNTER — Emergency Department

## 2024-06-22 ENCOUNTER — Emergency Department
Admission: EM | Admit: 2024-06-22 | Discharge: 2024-06-22 | Disposition: A | Discharge status: Home / Self Care | Attending: Emergency Medicine | Admitting: Emergency Medicine

## 2024-06-22 DIAGNOSIS — R519 Headache, unspecified: Secondary | ICD-10-CM | POA: Diagnosis present

## 2024-06-22 DIAGNOSIS — I10 Essential (primary) hypertension: Secondary | ICD-10-CM | POA: Insufficient documentation

## 2024-06-22 HISTORY — DX: Essential (primary) hypertension: I10

## 2024-06-22 LAB — COMPREHENSIVE METABOLIC PANEL WITH GFR
ALT: 14 U/L (ref 0–44)
AST: 21 U/L (ref 15–41)
Albumin: 3.7 g/dL (ref 3.5–5.0)
Alkaline Phosphatase: 58 U/L (ref 38–126)
Anion gap: 12 (ref 5–15)
BUN: 10 mg/dL (ref 6–20)
CO2: 25 mmol/L (ref 22–32)
Calcium: 8.9 mg/dL (ref 8.9–10.3)
Chloride: 102 mmol/L (ref 98–111)
Creatinine, Ser: 0.82 mg/dL (ref 0.44–1.00)
GFR, Estimated: >60 mL/min (ref >60)
Glucose, Bld: 117 mg/dL — ABNORMAL HIGH (ref 70–99)
Potassium: 3.8 mmol/L (ref 3.5–5.1)
Sodium: 139 mmol/L (ref 135–145)
Total Bilirubin: 0.5 mg/dL (ref 0.0–1.2)
Total Protein: 7.8 g/dL (ref 6.5–8.1)

## 2024-06-22 LAB — DIFFERENTIAL
Abs Immature Granulocytes: 0.02 K/uL (ref 0.00–0.07)
Basophils Absolute: 0 K/uL (ref 0.0–0.1)
Basophils Relative: 1 %
Eosinophils Absolute: 0.1 K/uL (ref 0.0–0.5)
Eosinophils Relative: 1 %
Immature Granulocytes: 0 %
Lymphocytes Relative: 37 %
Lymphs Abs: 3.2 K/uL (ref 0.7–4.0)
Monocytes Absolute: 0.4 K/uL (ref 0.1–1.0)
Monocytes Relative: 5 %
Neutro Abs: 4.9 K/uL (ref 1.7–7.7)
Neutrophils Relative %: 56 %

## 2024-06-22 LAB — ETHANOL: Alcohol, Ethyl (B): <15 mg/dL (ref <15)

## 2024-06-22 LAB — CBC
HCT: 41.1 % (ref 36.0–46.0)
Hemoglobin: 13.3 g/dL (ref 12.0–15.0)
MCH: 26.8 pg (ref 26.0–34.0)
MCHC: 32.4 g/dL (ref 30.0–36.0)
MCV: 82.9 fL (ref 80.0–100.0)
Platelets: 383 K/uL (ref 150–400)
RBC: 4.96 MIL/uL (ref 3.87–5.11)
RDW: 14.7 % (ref 11.5–15.5)
WBC: 8.8 K/uL (ref 4.0–10.5)
nRBC: 0 % (ref 0.0–0.2)

## 2024-06-22 LAB — PROTIME-INR
INR: 0.9 (ref 0.8–1.2)
Prothrombin Time: 12.9 s (ref 11.4–15.2)

## 2024-06-22 LAB — APTT: aPTT: 34 s (ref 24–36)

## 2024-06-22 MED ORDER — METOCLOPRAMIDE HCL 5 MG/ML IJ SOLN
20.0000 mg | Freq: Once | INTRAVENOUS | Status: DC
  Filled 2024-06-22: qty 4

## 2024-06-22 MED ORDER — DIPHENHYDRAMINE HCL 50 MG/ML IJ SOLN
25.0000 mg | Freq: Once | INTRAMUSCULAR | Status: AC
  Administered 2024-06-22: 25 mg via INTRAVENOUS
  Filled 2024-06-22: qty 1

## 2024-06-22 MED ORDER — SODIUM CHLORIDE 0.9 % IV BOLUS
500.0000 mL | Freq: Once | INTRAVENOUS | Status: AC
  Administered 2024-06-22: 500 mL via INTRAVENOUS

## 2024-06-22 MED ORDER — KETOROLAC TROMETHAMINE 30 MG/ML IJ SOLN
30.0000 mg | Freq: Once | INTRAMUSCULAR | Status: AC
  Administered 2024-06-22: 30 mg via INTRAVENOUS
  Filled 2024-06-22: qty 1

## 2024-06-22 MED ORDER — SODIUM CHLORIDE 0.9% FLUSH: 3.0000 mL | Freq: Once | INTRAVENOUS | Status: DC

## 2024-06-22 NOTE — ED Provider Notes (Signed)
 [facility] Provider Note    Event Date/Time   First MD Initiated Contact with Patient 06/22/24 1644     (approximate)   History   Hypertension, Headache, and Dizziness   HPI  Kaitlyn Hall is a 43 y.o. female who presents with complaints of headache.  Patient reports she does have a history of migraine headaches but they have been relatively well-controlled more recently.  She reports this headache is global throbbing and started about 2 days ago, has not improved with her typical treatment.  No neurodeficits, did feel slightly dizzy today but it is resolved.  She noted that her blood pressure has been elevated as well     Physical Exam   Triage Vital Signs: ED Triage Vitals  Encounter Vitals Group     BP 06/22/24 1532 (!) 164/93     Girls Systolic BP Percentile --      Girls Diastolic BP Percentile --      Boys Systolic BP Percentile --      Boys Diastolic BP Percentile --      Pulse Rate 06/22/24 1532 88     Resp 06/22/24 1532 16     Temp 06/22/24 1532 98.7 F (37.1 C)     Temp Source 06/22/24 1532 Oral     SpO2 06/22/24 1532 95 %     Weight 06/22/24 1532 (!) 142.9 kg (315 lb)     Height 06/22/24 1532 1.753 m (5' 9)     Head Circumference --      Peak Flow --      Pain Score 06/22/24 1545 9     Pain Loc --      Pain Education --      Exclude from Growth Chart --     Most recent vital signs: Vitals:   06/22/24 1532 06/22/24 1843  BP: (!) 164/93 137/83  Pulse: 88 88  Resp: 16 18  Temp: 98.7 F (37.1 C)   SpO2: 95% 99%     General: Awake, no distress.  CV:  Good peripheral perfusion.  Resp:  Normal effort.  Abd:  No distention.  Other:  Normal neurologic exam, cranial nerves II through XII are normal.   ED Results / Procedures / Treatments   Labs (all labs ordered are listed, but only abnormal results are displayed) Labs Reviewed  COMPREHENSIVE METABOLIC PANEL WITH GFR - Abnormal; Notable for the following components:       Result Value   Glucose, Bld 117 (*)    All other components within normal limits  PROTIME-INR  APTT  CBC  DIFFERENTIAL  ETHANOL  I-STAT CREATININE, ED  CBG MONITORING, ED  POC URINE PREG, ED     EKG     RADIOLOGY CT head viewed interpret by me, no acute abnormality, confirmed by radiology    PROCEDURES:  Critical Care performed:   Procedures   MEDICATIONS ORDERED IN ED: Medications  sodium chloride  flush (NS) 0.9 % injection 3 mL (3 mLs Intravenous Not Given 06/22/24 1810)  ketorolac  (TORADOL ) 30 MG/ML injection 30 mg (30 mg Intravenous Given 06/22/24 1804)  diphenhydrAMINE  (BENADRYL ) injection 25 mg (25 mg Intravenous Given 06/22/24 1804)  sodium chloride  0.9 % bolus 500 mL (0 mLs Intravenous Stopped 06/22/24 1840)     IMPRESSION / MDM / ASSESSMENT AND PLAN / ED COURSE  I reviewed the triage vital signs and the nursing notes. Patient's presentation is most consistent with acute presentation with potential threat to life or bodily function.  Patient presents with headache as described above, differential includes migraine headache but given atypical nature of this will obtain CT imaging to evaluate for ICH or other abnormality.  Will treat with headache cocktail  Lab work reviewed and is reassuring  CT head is unremarkable.  Patient feeling much better after treatment, appropriate for discharge at this time with outpatient follow-up, return precautions discussed, she agreed to this plan      FINAL CLINICAL IMPRESSION(S) / ED DIAGNOSES   Final diagnoses:  Acute nonintractable headache, unspecified headache type  Hypertension, unspecified type     Rx / DC Orders   ED Discharge Orders     None        Note:  This document was prepared using Dragon voice recognition software and may include unintentional dictation errors.   Arlander Charleston, MD 06/22/24 2024

## 2024-06-22 NOTE — ED Triage Notes (Signed)
 Pt to ED via POV for c/o high blood pressure, headache, dizziness, blurred vision, and moments of brain fog that has been ongoing since Saturday. Pt compliant with BP medication. Pt A&O.
